# Patient Record
Sex: Male | Born: 1965 | Race: White | Hispanic: No | Marital: Single | Smoking: Current every day smoker
Health system: Southern US, Community
[De-identification: ages and names within clinical notes are randomized; demographics above are authoritative.]

## PROBLEM LIST (undated history)

## (undated) DIAGNOSIS — H547 Unspecified visual loss: Secondary | ICD-10-CM

## (undated) HISTORY — PX: WRIST FRACTURE SURGERY: SHX121

## (undated) HISTORY — PX: EYE SURGERY: SHX253

---

## 2016-03-26 DIAGNOSIS — H3552 Pigmentary retinal dystrophy: Secondary | ICD-10-CM | POA: Diagnosis not present

## 2016-03-26 DIAGNOSIS — H540X44 Blindness right eye category 4, blindness left eye category 4: Secondary | ICD-10-CM | POA: Diagnosis not present

## 2016-03-26 DIAGNOSIS — H5372 Impaired contrast sensitivity: Secondary | ICD-10-CM | POA: Diagnosis not present

## 2016-03-26 DIAGNOSIS — Z961 Presence of intraocular lens: Secondary | ICD-10-CM | POA: Diagnosis not present

## 2016-12-31 DIAGNOSIS — H6980 Other specified disorders of Eustachian tube, unspecified ear: Secondary | ICD-10-CM | POA: Diagnosis not present

## 2017-04-04 DIAGNOSIS — J209 Acute bronchitis, unspecified: Secondary | ICD-10-CM | POA: Diagnosis not present

## 2017-11-13 DIAGNOSIS — H6691 Otitis media, unspecified, right ear: Secondary | ICD-10-CM | POA: Diagnosis not present

## 2017-11-13 DIAGNOSIS — J209 Acute bronchitis, unspecified: Secondary | ICD-10-CM | POA: Diagnosis not present

## 2018-05-15 DIAGNOSIS — R6889 Other general symptoms and signs: Secondary | ICD-10-CM | POA: Diagnosis not present

## 2018-06-05 ENCOUNTER — Emergency Department (HOSPITAL_COMMUNITY): Payer: Medicare Other

## 2018-06-05 ENCOUNTER — Other Ambulatory Visit: Payer: Self-pay

## 2018-06-05 ENCOUNTER — Emergency Department (HOSPITAL_COMMUNITY)
Admission: EM | Admit: 2018-06-05 | Discharge: 2018-06-05 | Disposition: A | Discharge status: Home / Self Care | Payer: Medicare Other | Attending: Emergency Medicine | Admitting: Emergency Medicine

## 2018-06-05 DIAGNOSIS — R4020 Unspecified coma: Secondary | ICD-10-CM | POA: Diagnosis not present

## 2018-06-05 DIAGNOSIS — R55 Syncope and collapse: Secondary | ICD-10-CM | POA: Insufficient documentation

## 2018-06-05 DIAGNOSIS — R05 Cough: Secondary | ICD-10-CM | POA: Diagnosis not present

## 2018-06-05 DIAGNOSIS — J40 Bronchitis, not specified as acute or chronic: Secondary | ICD-10-CM | POA: Diagnosis not present

## 2018-06-05 DIAGNOSIS — R42 Dizziness and giddiness: Secondary | ICD-10-CM | POA: Diagnosis not present

## 2018-06-05 DIAGNOSIS — Z79899 Other long term (current) drug therapy: Secondary | ICD-10-CM | POA: Diagnosis not present

## 2018-06-05 DIAGNOSIS — R0602 Shortness of breath: Secondary | ICD-10-CM | POA: Diagnosis not present

## 2018-06-05 LAB — CBC WITH DIFFERENTIAL/PLATELET
Abs Immature Granulocytes: 0.03 K/uL (ref 0.00–0.07)
Basophils Absolute: 0 K/uL (ref 0.0–0.1)
Basophils Relative: 0 %
Eosinophils Absolute: 0.2 K/uL (ref 0.0–0.5)
Eosinophils Relative: 2 %
HCT: 42.4 % (ref 39.0–52.0)
Hemoglobin: 14.5 g/dL (ref 13.0–17.0)
Immature Granulocytes: 0 %
Lymphocytes Relative: 26 %
Lymphs Abs: 2.1 K/uL (ref 0.7–4.0)
MCH: 32.4 pg (ref 26.0–34.0)
MCHC: 34.2 g/dL (ref 30.0–36.0)
MCV: 94.6 fL (ref 80.0–100.0)
Monocytes Absolute: 0.9 K/uL (ref 0.1–1.0)
Monocytes Relative: 11 %
Neutro Abs: 4.9 K/uL (ref 1.7–7.7)
Neutrophils Relative %: 61 %
Platelets: 247 K/uL (ref 150–400)
RBC: 4.48 MIL/uL (ref 4.22–5.81)
RDW: 13.7 % (ref 11.5–15.5)
WBC: 8.1 K/uL (ref 4.0–10.5)
nRBC: 0 % (ref 0.0–0.2)

## 2018-06-05 LAB — COMPREHENSIVE METABOLIC PANEL
ALBUMIN: 4.4 g/dL (ref 3.5–5.0)
ALT: 16 U/L (ref 0–44)
AST: 21 U/L (ref 15–41)
Alkaline Phosphatase: 55 U/L (ref 38–126)
Anion gap: 7 (ref 5–15)
BUN: 9 mg/dL (ref 6–20)
CO2: 24 mmol/L (ref 22–32)
Calcium: 8.7 mg/dL — ABNORMAL LOW (ref 8.9–10.3)
Chloride: 102 mmol/L (ref 98–111)
Creatinine, Ser: 0.66 mg/dL (ref 0.61–1.24)
GFR calc Af Amer: >60 mL/min (ref >60)
GFR calc non Af Amer: >60 mL/min (ref >60)
GLUCOSE: 95 mg/dL (ref 70–99)
Potassium: 4.2 mmol/L (ref 3.5–5.1)
Sodium: 133 mmol/L — ABNORMAL LOW (ref 135–145)
Total Bilirubin: 0.7 mg/dL (ref 0.3–1.2)
Total Protein: 7 g/dL (ref 6.5–8.1)

## 2018-06-05 LAB — D-DIMER, QUANTITATIVE: D-Dimer, Quant: 0.52 ug{FEU}/mL — ABNORMAL HIGH (ref 0.00–0.50)

## 2018-06-05 LAB — TROPONIN I: Troponin I: <0.03 ng/mL (ref <0.03)

## 2018-06-05 MED ORDER — SODIUM CHLORIDE 0.9 % IV BOLUS
500.0000 mL | Freq: Once | INTRAVENOUS | Status: AC
  Administered 2018-06-05: 500 mL via INTRAVENOUS

## 2018-06-05 MED ORDER — ALBUTEROL SULFATE HFA 108 (90 BASE) MCG/ACT IN AERS
2.0000 | INHALATION_SPRAY | Freq: Once | RESPIRATORY_TRACT | Status: AC
  Administered 2018-06-05: 2 via RESPIRATORY_TRACT
  Filled 2018-06-05: qty 6.7

## 2018-06-05 MED ORDER — IOHEXOL 350 MG/ML SOLN
100.0000 mL | Freq: Once | INTRAVENOUS | Status: AC | PRN
  Administered 2018-06-05: 80 mL via INTRAVENOUS

## 2018-06-05 MED ORDER — SODIUM CHLORIDE (PF) 0.9 % IJ SOLN
INTRAMUSCULAR | Status: AC
  Filled 2018-06-05: qty 50

## 2018-06-05 NOTE — ED Triage Notes (Signed)
Pt BIBA from [employer] (blind for 25 yr) c/o being very light headed, blacked out for 15 sec.  Nearby witness able to ease him to floor prior to falling. C/o chest tightness (pressure) x1 week.

## 2018-06-05 NOTE — ED Provider Notes (Signed)
[facility]-EMERGENCY DEPT Provider Note   CSN: [id number] Arrival date & time: 06/05/18  0847    History   Chief Complaint Chief Complaint  Patient presents with   Near Syncope    HPI Henry Roberts is a 53 y.o. male.     The history is provided by the patient. No language interpreter was used.  Near Syncope    Henry Roberts is a 53 y.o. male who presents to the Emergency Department complaining of near syncope. He presents to the emergency department by EMS for evaluation following a near syncopal event. He states that he was at work this morning and standing for about an hour when he began to feel lightheaded and dizzy. Things began to go dark but he states that he did not pass out. He lowered himself to the ground and then he began to feel better. He does have associated shortness of breath over the last week with chest tightness. Symptoms are worse on exertion. He has a chronic cough that is also worse over the last 1 to 3 weeks. He denies any fevers or chills. He was diaphoretic during his near syncopal event. He denies any leg swelling or pain. No vomiting, diarrhea, abdominal pain. 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 He does not take any medications. He does smoke cigarettes and drinks alcohol regularly. He has no personal or family history of blood clots or cardiac disease. No past medical history on file.  There are no active problems to display for this patient.       he has no significant past medical history.  Home Medications    Prior to Admission medications   Medication Sig Start Date End Date Taking? Authorizing Provider  Ascorbic Acid (VITAMIN C) 1000 MG tablet Take 1,000 mg by mouth daily.   Yes [provider]  Cholecalciferol (HM VITAMIN D3) 100 MCG (4000 UT) CAPS Take 4,000 Units by mouth daily.    Yes [provider]  Pseudoephedrine-APAP-DM (DAYQUIL MULTI-SYMPTOM PO) Take 1 capsule by mouth daily as  needed (congestion).   Yes [provider]  Zinc 30 MG CAPS Take 30 mg by mouth daily.   Yes [provider]    Family History No family history on file.  Social History Social History   Tobacco Use   Smoking status: Not on file  Substance Use Topics   Alcohol use: Not on file   Drug use: Not on file     Allergies   Patient has no allergy information on record.   Review of Systems Review of Systems  Cardiovascular: Positive for near-syncope.  All other systems reviewed and are negative.    Physical Exam Updated Vital Signs BP 124/75    Pulse (!) 58    Temp 98.3 F (36.8 C) (Oral)    Resp 15    Ht 5\' 6"  (1.676 m)    Wt 74.8 kg    SpO2 99%    BMI 26.63 kg/m   Physical Exam Vitals signs and nursing note reviewed.  Constitutional:      Appearance: He is well-developed.  HENT:     Head: Normocephalic and atraumatic.  Cardiovascular:     Rate and Rhythm: Normal rate and regular rhythm.     Heart sounds: No murmur.  Pulmonary:     Effort: Pulmonary effort is normal. No respiratory distress.     Breath sounds: Normal breath sounds.  Abdominal:     Palpations: Abdomen is soft.  Tenderness: There is no abdominal tenderness. There is no guarding or rebound.  Musculoskeletal:        General: No swelling or tenderness.  Skin:    General: Skin is warm and dry.  Neurological:     Mental Status: He is alert and oriented to person, place, and time.  Psychiatric:        Behavior: Behavior normal.      ED Treatments / Results  Labs (all labs ordered are listed, but only abnormal results are displayed) Labs Reviewed  COMPREHENSIVE METABOLIC PANEL - Abnormal; Notable for the following components:      Result Value   Sodium 133 (*)    Calcium 8.7 (*)    All other components within normal limits  D-DIMER, QUANTITATIVE (NOT AT [facility]) - Abnormal; Notable for the following components:   D-Dimer, Quant 0.52 (*)    All other components within normal  limits  TROPONIN I  CBC WITH DIFFERENTIAL/PLATELET    EKG EKG Interpretation  Date/Time:  Friday June 05 2018 09:27:51 EDT Ventricular Rate:  55 PR Interval:    QRS Duration: 92 QT Interval:  438 QTC Calculation: 419 R Axis:   49 Text Interpretation:  Sinus rhythm no prior available for comparison Confirmed by Tilden Fossa [phone]) on 06/05/2018 9:34:21 AM   Radiology Ct Angio Chest Pe W/cm &/or Wo Cm  Result Date: 06/05/2018 CLINICAL DATA:  53 year old male with syncope EXAM: CT ANGIOGRAPHY CHEST WITH CONTRAST TECHNIQUE: Multidetector CT imaging of the chest was performed using the standard protocol during bolus administration of intravenous contrast. Multiplanar CT image reconstructions and MIPs were obtained to evaluate the vascular anatomy. CONTRAST:  13mL OMNIPAQUE IOHEXOL 350 MG/ML SOLN COMPARISON:  None. FINDINGS: Cardiovascular: Heart: No cardiomegaly. No pericardial fluid/thickening. No significant coronary calcifications. Aorta: Unremarkable course, caliber, contour of the thoracic aorta. No aneurysm or dissection flap. No periaortic fluid. Mild atherosclerotic changes of the aortic arch. Pulmonary arteries: No central, lobar, segmental, or proximal subsegmental filling defects. Mediastinum/Nodes: No mediastinal adenopathy. Unremarkable appearance of the thoracic esophagus. Unremarkable appearance of the thoracic inlet and thyroid. Lungs/Pleura: Central airways clear. Mild bronchial wall thickening. No pleural effusion. No confluent airspace disease. No pneumothorax. Upper Abdomen: No acute. Musculoskeletal: No acute displaced fracture. No significant degenerative changes of the spine. Review of the MIP images confirms the above findings. IMPRESSION: CT negative for pulmonary emboli. Mild bronchial wall thickening, may indicate acute or chronic bronchitis, with no evidence of pneumonia. Aortic Atherosclerosis (ICD10-I70.0). Electronically Signed   By: Gilmer Mor D.O.   On:  06/05/2018 11:27   Dg Chest Port 1 View  Result Date: 06/05/2018 CLINICAL DATA:  Syncope with cough and shortness of breath EXAM: PORTABLE CHEST 1 VIEW COMPARISON:  None. FINDINGS: Lungs are clear. Heart size and pulmonary vascularity are normal. No adenopathy. There is aortic atherosclerosis. No bone lesions. IMPRESSION: No edema or consolidation. Heart size within normal limits. Aortic Atherosclerosis (ICD10-I70.0). Electronically Signed   By: Bretta Bang III M.D.   On: 06/05/2018 09:24    Procedures Procedures (including critical care time)  Medications Ordered in ED Medications  sodium chloride (PF) 0.9 % injection (has no administration in time range)  sodium chloride 0.9 % bolus 500 mL (0 mLs Intravenous Stopped 06/05/18 1207)  iohexol (OMNIPAQUE) 350 MG/ML injection 100 mL (80 mLs Intravenous Contrast Given 06/05/18 1105)  albuterol (PROVENTIL HFA;VENTOLIN HFA) 108 (90 Base) MCG/ACT inhaler 2 puff (2 puffs Inhalation Given 06/05/18 1213)     Initial Impression / Assessment  and Plan / ED Course  I have reviewed the triage vital signs and the nursing notes.  Pertinent labs & imaging results that were available during my care of the patient were reviewed by me and considered in my medical decision making (see chart for details).        Patient presents to the emergency department for evaluation following a near syncopal episode. He has experienced increased shortness of breath over the last few weeks. He has no risk factors for COVID infection. He is in no acute distress on examination. Current presentation is not consistent with ACS, CHF, pneumonia. D dimer was mildly elevated and CTA was obtained, which was negative for PE. Given his history of tobacco abuse, feel he may have an element of chronic bronchitis. Discussed with patient smoking cessation. Will provide MDI to see if he has improvement in his symptoms. Discussed importance of outpatient follow-up and return  precautions.  BMP with mild hyponatremia, may be secondary to chronic alcohol use versus dehydration, he was treated with IV fluids.  Final Clinical Impressions(s) / ED Diagnoses   Final diagnoses:  Near syncope  Bronchitis    ED Discharge Orders    None       Tilden Fossa, MD 06/05/18 1237

## 2018-06-05 NOTE — ED Notes (Signed)
Bed: WA09 Expected date:  Expected time:  Means of arrival:  Comments: EMS-syncope 

## 2018-06-05 NOTE — Discharge Instructions (Addendum)
You can use 2 puffs every 4-6 hours as needed for shortness of breath or wheezing.  Get rechecked immediately if you develop severe symptoms.    Drink plenty of fluids.

## 2018-06-05 NOTE — ED Notes (Signed)
Discharge paperwork reviewed with pt, IV removed, pt assisted to ED entrance where he was going to order an UBER for transport home.  Pt did not have any questions or concerns at this time.  Pt ambulatory at discharge.

## 2018-06-05 NOTE — ED Notes (Signed)
Pt assisted to ambulate to bathroom for urine sample.  Pt able to walk appx 100 feet, using blind cane, with no issue.

## 2018-06-10 ENCOUNTER — Telehealth: Payer: Medicare Other | Admitting: Family

## 2018-06-10 DIAGNOSIS — Z7189 Other specified counseling: Secondary | ICD-10-CM

## 2018-06-10 DIAGNOSIS — R6889 Other general symptoms and signs: Secondary | ICD-10-CM | POA: Diagnosis not present

## 2018-06-10 MED ORDER — BENZONATATE 100 MG PO CAPS: 100.0000 mg | ORAL_CAPSULE | Freq: Three times a day (TID) | ORAL | 0 refills | Status: DC | PRN

## 2018-06-10 NOTE — Progress Notes (Signed)
E-Visit for Corona Virus Screening  Based on your current symptoms, you may very well have the virus, however your symptoms are mild. Currently, not all patients are being tested. If the symptoms are mild and there is not a known exposure, performing the test is not indicated.   Approximately 5 minutes was spent documenting and reviewing patient's chart.    Coronavirus disease 2019 (COVID-19) is a respiratory illness that can spread from person to person. The virus that causes COVID-19 is a new virus that was first identified in the country of [place] but is now found in multiple other countries and has spread to the [place].  Symptoms associated with the virus are mild to severe fever, cough, and shortness of breath. There is currently no vaccine to protect against COVID-19, and there is no specific antiviral treatment for the virus.   To be considered HIGH RISK for Coronavirus (COVID-19), you have to meet the following criteria:  . Traveled to [place], [place], [place], [place] or [place]; or in the [place] to [place], [place], [place], or [place]; and have fever, cough, and shortness of breath within the last 2 weeks of travel OR  . Been in close contact with a person diagnosed with COVID-19 within the last 2 weeks and have fever, cough, and shortness of breath  . IF YOU DO NOT MEET THESE CRITERIA, YOU ARE CONSIDERED LOW RISK FOR COVID-19.   It is vitally important that if you feel that you have an infection such as this virus or any other virus that you stay home and away from places where you may spread it to others.  You should self-quarantine for 14 days if you have symptoms that could potentially be coronavirus and avoid contact with people age 65 and older.   You can use medication such as A prescription cough medication called Tessalon Perles 100 mg. You may take 1-2 capsules every 8 hours as needed for cough  You may also take acetaminophen (Tylenol) as needed for  fever.   Reduce your risk of any infection by using the same precautions used for avoiding the common cold or flu:  . Wash your hands often with soap and warm water for at least 20 seconds.  If soap and water are not readily available, use an alcohol-based hand sanitizer with at least 60% alcohol.  . If coughing or sneezing, cover your mouth and nose by coughing or sneezing into the elbow areas of your shirt or coat, into a tissue or into your sleeve (not your hands). . Avoid shaking hands with others and consider head nods or verbal greetings only. . Avoid touching your eyes, nose, or mouth with unwashed hands.  . Avoid close contact with people who are sick. . Avoid places or events with large numbers of people in one location, like concerts or sporting events. . Carefully consider travel plans you have or are making. . If you are planning any travel outside or inside the [place], visit the CDC's Travelers' Health webpage for the latest health notices. . If you have some symptoms but not all symptoms, continue to monitor at home and seek medical attention if your symptoms worsen. . If you are having a medical emergency, call 911.  HOME CARE . Only take medications as instructed by your medical team. . Drink plenty of fluids and get plenty of rest. . A steam or ultrasonic humidifier can help if you have congestion.   GET HELP RIGHT AWAY IF: .   You develop worsening fever. . You become short of breath . You cough up blood. . Your symptoms become more severe MAKE SURE YOU   Understand these instructions.  Will watch your condition.  Will get help right away if you are not doing well or get worse.  Your e-visit answers were reviewed by a board certified advanced clinical practitioner to complete your personal care plan.  Depending on the condition, your plan could have included both over the counter or prescription medications.  If there is a problem please reply once you have received a  response from your provider. Your safety is important to us.  If you have drug allergies check your prescription carefully.    You can use MyChart to ask questions about today's visit, request a non-urgent call back, or ask for a work or school excuse for 24 hours related to this e-Visit. If it has been greater than 24 hours you will need to follow up with your provider, or enter a new e-Visit to address those concerns. You will get an e-mail in the next two days asking about your experience.  I hope that your e-visit has been valuable and will speed your recovery. Thank you for using e-visits.    

## 2018-06-22 ENCOUNTER — Telehealth: Payer: Medicare Other | Admitting: Physician Assistant

## 2018-06-22 DIAGNOSIS — R05 Cough: Secondary | ICD-10-CM

## 2018-06-22 DIAGNOSIS — R059 Cough, unspecified: Secondary | ICD-10-CM

## 2018-06-22 NOTE — Progress Notes (Signed)
Patient reports improvement since e-vist on 06/10/2018. Requesting return to work note. Work note written and sent to patient.

## 2018-06-23 ENCOUNTER — Ambulatory Visit (HOSPITAL_COMMUNITY)
Admission: EM | Admit: 2018-06-23 | Discharge: 2018-06-23 | Disposition: A | Discharge status: Home / Self Care | Payer: Medicare Other | Attending: Family Medicine | Admitting: Family Medicine

## 2018-06-23 ENCOUNTER — Encounter (HOSPITAL_COMMUNITY): Payer: Self-pay | Admitting: Emergency Medicine

## 2018-06-23 ENCOUNTER — Other Ambulatory Visit: Payer: Self-pay

## 2018-06-23 DIAGNOSIS — Z0289 Encounter for other administrative examinations: Secondary | ICD-10-CM

## 2018-06-23 DIAGNOSIS — Z7689 Persons encountering health services in other specified circumstances: Secondary | ICD-10-CM

## 2018-06-23 HISTORY — DX: Unspecified visual loss: H54.7

## 2018-06-23 NOTE — ED Triage Notes (Signed)
Pt here for note to return after 14 day quarantine

## 2018-06-23 NOTE — Discharge Instructions (Addendum)
Your exam was normal.  I feel it is safe for you to return to work.

## 2018-06-24 NOTE — ED Provider Notes (Signed)
[facility]    CSN: [id number] Arrival date & time: 06/23/18  1443     History   Chief Complaint Chief Complaint  Patient presents with  . Letter for School/Work    HPI Henry Roberts is a 53 y.o. male.   Pt is a 53 year old male with past medical history of blindness.  He presents today with wanting a note to return to work.  He was seen approximately 2 weeks ago and told to have some sort of viral illness and could not rule out coronavirus.  He has been quarantined for 14 days.  Reports that he has not had any fever or symptoms and 7 days.  He would like to return to work on Thursday. No fever, chills, body aches, night sweats, cough, congestion, ear pain, sore throat.   ROS per HPI      Past Medical History:  Diagnosis Date  . Blind     There are no active problems to display for this patient.   History reviewed. No pertinent surgical history.     Home Medications    Prior to Admission medications   Medication Sig Start Date End Date Taking? Authorizing Provider  Ascorbic Acid (VITAMIN C) 1000 MG tablet Take 1,000 mg by mouth daily.    [provider]  benzonatate (TESSALON PERLES) 100 MG capsule Take 1 capsule (100 mg total) by mouth 3 (three) times daily as needed. 06/10/18   Jannifer RodneyHawks, Christy A, FNP  Cholecalciferol (HM VITAMIN D3) 100 MCG (4000 UT) CAPS Take 4,000 Units by mouth daily.     [provider]  Pseudoephedrine-APAP-DM (DAYQUIL MULTI-SYMPTOM PO) Take 1 capsule by mouth daily as needed (congestion).    [provider]  Zinc 30 MG CAPS Take 30 mg by mouth daily.    [provider]    Family History History reviewed. No pertinent family history.  Social History Social History   Tobacco Use  . Smoking status: Current Every Day Smoker  . Smokeless tobacco: Never Used  Substance Use Topics  . Alcohol use: Never    Frequency: Never  . Drug use: Never     Allergies   Patient has no known allergies.   Review of Systems Review of Systems   Physical Exam Triage Vital Signs ED Triage Vitals  Enc Vitals Group     BP 06/23/18 1510 132/82     Pulse Rate 06/23/18 1510 84     Resp 06/23/18 1510 18     Temp 06/23/18 1510 97.9 F (36.6 C)     Temp Source 06/23/18 1510 Oral     SpO2 06/23/18 1510 96 %     Weight --      Height --      Head Circumference --      Peak Flow --      Pain Score 06/23/18 1511 0     Pain Loc --      Pain Edu? --      Excl. in [place]? --    No data found.  Updated Vital Signs BP 132/82 (BP Location: Right Arm)   Pulse 84   Temp 97.9 F (36.6 C) (Oral)   Resp 18   SpO2 96%   Visual Acuity Right Eye Distance:   Left Eye Distance:   Bilateral Distance:    Right Eye Near:   Left Eye Near:    Bilateral Near:     Physical Exam Vitals signs and nursing note reviewed.  Constitutional:  General: He is not in acute distress.    Appearance: He is well-developed. He is not ill-appearing, toxic-appearing or diaphoretic.  HENT:     Head: Normocephalic and atraumatic.     Right Ear: Tympanic membrane and ear canal normal.     Left Ear: Tympanic membrane and ear canal normal.     Nose: Nose normal.     Mouth/Throat:     Pharynx: Oropharynx is clear.  Eyes:     Conjunctiva/sclera: Conjunctivae normal.  Neck:     Musculoskeletal: Normal range of motion and neck supple.  Cardiovascular:     Rate and Rhythm: Normal rate and regular rhythm.     Heart sounds: No murmur.  Pulmonary:     Effort: Pulmonary effort is normal. No respiratory distress.     Breath sounds: Normal breath sounds.  Abdominal:     Palpations: Abdomen is soft.     Tenderness: There is no abdominal tenderness.  Musculoskeletal: Normal range of motion.  Skin:    General: Skin is warm and dry.  Neurological:     Mental Status: He is alert.  Psychiatric:        Mood and Affect: Mood normal.      UC Treatments / Results  Labs (all labs ordered are listed, but only abnormal  results are displayed) Labs Reviewed - No data to display  EKG None  Radiology No results found.  Procedures Procedures (including critical care time)  Medications Ordered in UC Medications - No data to display  Initial Impression / Assessment and Plan / UC Course  I have reviewed the triage vital signs and the nursing notes.  Pertinent labs & imaging results that were available during my care of the patient were reviewed by me and considered in my medical decision making (see chart for details).     Pt exam normal. It is safe for him to return to work  Work release given.  Final Clinical Impressions(s) / UC Diagnoses   Final diagnoses:  Return to work exam     Discharge Instructions     Your exam was normal.  I feel it is safe for you to return to work.      ED Prescriptions    None     Controlled Substance Prescriptions [place] Controlled Substance Registry consulted? Not Applicable   Janace Aris, NP 06/24/18 [phone]

## 2018-06-29 ENCOUNTER — Encounter (HOSPITAL_COMMUNITY): Payer: Self-pay

## 2018-06-29 ENCOUNTER — Ambulatory Visit (HOSPITAL_COMMUNITY)
Admission: EM | Admit: 2018-06-29 | Discharge: 2018-06-29 | Disposition: A | Discharge status: Home / Self Care | Payer: Medicare Other | Attending: Family Medicine | Admitting: Family Medicine

## 2018-06-29 ENCOUNTER — Other Ambulatory Visit: Payer: Self-pay

## 2018-06-29 DIAGNOSIS — M791 Myalgia, unspecified site: Secondary | ICD-10-CM | POA: Insufficient documentation

## 2018-06-29 LAB — CBC
HCT: 42.6 % (ref 39.0–52.0)
Hemoglobin: 14.7 g/dL (ref 13.0–17.0)
MCH: 31.7 pg (ref 26.0–34.0)
MCHC: 34.5 g/dL (ref 30.0–36.0)
MCV: 92 fL (ref 80.0–100.0)
Platelets: 251 10*3/uL (ref 150–400)
RBC: 4.63 MIL/uL (ref 4.22–5.81)
RDW: 13.4 % (ref 11.5–15.5)
WBC: 10.5 10*3/uL (ref 4.0–10.5)
nRBC: 0 % (ref 0.0–0.2)

## 2018-06-29 LAB — SEDIMENTATION RATE: Sed Rate: 0 mm/hr (ref 0–16)

## 2018-06-29 LAB — C-REACTIVE PROTEIN: CRP: <0.8 mg/dL (ref <1.0)

## 2018-06-29 NOTE — ED Triage Notes (Signed)
Pt cc he has muscle pain in his arm and legs. Pt states he feels weak. This started today.

## 2018-06-29 NOTE — Discharge Instructions (Addendum)
We will call you with the lab work from today  Please follow up if your pain doesn't improve  Please try to establish care with [facility] physicians.  Please return if your symptoms worsen.

## 2018-06-29 NOTE — ED Provider Notes (Signed)
[place]    CSN: [id number] Arrival date & time: 06/29/18  0932     History   Chief Complaint Chief Complaint  Patient presents with  . Muscle Pain    HPI Henry Roberts is a 53 y.o. male. is presenting today with b/l upper arm pain and b/l thigh pain. This started this morning. He feels weak and doesn't feel he would be able to do his job. 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 He was recently placed in quarantine for suspected coronavirus infection. He was released to go back to work on 4/14. Denies any fevers.  HPI  Past Medical History:  Diagnosis Date  . Blind     There are no active problems to display for this patient.   History reviewed. No pertinent surgical history.     Home Medications    Prior to Admission medications   Medication Sig Start Date End Date Taking? Authorizing Provider  Ascorbic Acid (VITAMIN C) 1000 MG tablet Take 1,000 mg by mouth daily.    [provider]  benzonatate (TESSALON PERLES) 100 MG capsule Take 1 capsule (100 mg total) by mouth 3 (three) times daily as needed. 06/10/18   Evelina Dun A, FNP  Cholecalciferol (HM VITAMIN D3) 100 MCG (4000 UT) CAPS Take 4,000 Units by mouth daily.     [provider]  Pseudoephedrine-APAP-DM (DAYQUIL MULTI-SYMPTOM PO) Take 1 capsule by mouth daily as needed (congestion).    [provider]  Zinc 30 MG CAPS Take 30 mg by mouth daily.    [provider]    Family History History reviewed. No pertinent family history.  Social History Social History   Tobacco Use  . Smoking status: Current Every Day Smoker  . Smokeless tobacco: Never Used  Substance Use Topics  . Alcohol use: Never    Frequency: Never  . Drug use: Never     Allergies   Patient has no known allergies.   Review of Systems Review of Systems  Constitutional: Negative for fever.  HENT: Negative for congestion.   Respiratory: Negative for  cough.   Cardiovascular: Negative for chest pain.  Gastrointestinal: Negative for abdominal pain.  Musculoskeletal: Negative for gait problem.  Skin: Negative for color change.  Neurological: Positive for weakness.  Hematological: Negative for adenopathy.     Physical Exam Triage Vital Signs ED Triage Vitals  Enc Vitals Group     BP 06/29/18 0948 118/86     Pulse Rate 06/29/18 0948 74     Resp 06/29/18 0948 18     Temp 06/29/18 0948 98.1 F (36.7 C)     Temp Source 06/29/18 0948 Oral     SpO2 06/29/18 0948 99 %     Weight 06/29/18 0949 165 lb (74.8 kg)     Height --      Head Circumference --      Peak Flow --      Pain Score 06/29/18 0949 3     Pain Loc --      Pain Edu? --      Excl. in [place]? --    No data found.  Updated Vital Signs BP 118/86 (BP Location: Right Arm)   Pulse 74   Temp 98.1 F (36.7 C) (Oral)   Resp 18   Wt 74.8 kg   SpO2 99%   BMI 26.63 kg/m   Visual Acuity Right Eye Distance:   Left Eye Distance:  Bilateral Distance:    Right Eye Near:   Left Eye Near:    Bilateral Near:     Physical Exam  Gen: NAD, alert, cooperative with exam, well-appearing ENT: normal lips, normal nasal mucosa,  Eye:  normal conjunctiva and lids CV:  no edema, +2 pedal pulses   Resp: no accessory muscle use, non-labored,  Skin: no rashes, no areas of induration  Neuro: normal tone, normal sensation to touch Psych:  normal insight, alert and oriented MSK:  Upper arm:  Normal ROM  TTP along the medial bicep b/l  Normal shoulder ROM  Normal grip strength  Left and right leg:  Normal IR and ER of the hips  Normal strength to hip flexion, knee flexion and extension  Neurovascularly intact     UC Treatments / Results  Labs (all labs ordered are listed, but only abnormal results are displayed) Labs Reviewed  CBC  SEDIMENTATION RATE  C-REACTIVE PROTEIN    EKG None  Radiology No results found.  Procedures Procedures (including critical care time)   Medications Ordered in UC Medications - No data to display  Initial Impression / Assessment and Plan / UC Course  I have reviewed the triage vital signs and the nursing notes.  Pertinent labs & imaging results that were available during my care of the patient were reviewed by me and considered in my medical decision making (see chart for details).     Henry Roberts is a 53 yo M that is presenting with myalgias. Occurring for one day in duration. Bilateral upper arm and thighs are areas of pain and weakness. Unclear if PMR or post viral. No weakness or decreases in range of motion. Will check ESR, CRP and CBC. Counseled on follow up and return.   Final Clinical Impressions(s) / UC Diagnoses   Final diagnoses:  Myalgia     Discharge Instructions     We will call you with the lab work from today  Please follow up if your pain doesn't improve  Please try to establish care with [facility] physicians.  Please return if your symptoms worsen.     ED Prescriptions    None     Controlled Substance Prescriptions [place] Controlled Substance Registry consulted? Not Applicable   Rosemarie Ax, MD 06/29/18 1044

## 2018-07-02 ENCOUNTER — Telehealth: Payer: Self-pay | Admitting: Family Medicine

## 2018-07-02 NOTE — Telephone Encounter (Signed)
Left VM for patient. If he calls back please have him speak with a nurse/CMA and inform that all of his lab work is normal. If he is still having symptoms then he should establish care with a primary care and go from there. The [facility] can report results to patient.   If any questions then please take the best time and phone number to call and I will try to call him back.   Myra Rude, MD [facility] 07/02/2018, 8:34 AM

## 2019-04-13 DIAGNOSIS — Z03818 Encounter for observation for suspected exposure to other biological agents ruled out: Secondary | ICD-10-CM | POA: Diagnosis not present

## 2019-04-14 DIAGNOSIS — R05 Cough: Secondary | ICD-10-CM | POA: Diagnosis not present

## 2019-04-14 DIAGNOSIS — Z20828 Contact with and (suspected) exposure to other viral communicable diseases: Secondary | ICD-10-CM | POA: Diagnosis not present

## 2019-04-16 DIAGNOSIS — Z20828 Contact with and (suspected) exposure to other viral communicable diseases: Secondary | ICD-10-CM | POA: Diagnosis not present

## 2019-04-17 DIAGNOSIS — Z20828 Contact with and (suspected) exposure to other viral communicable diseases: Secondary | ICD-10-CM | POA: Diagnosis not present

## 2019-04-17 DIAGNOSIS — R05 Cough: Secondary | ICD-10-CM | POA: Diagnosis not present

## 2019-05-14 DIAGNOSIS — R5383 Other fatigue: Secondary | ICD-10-CM | POA: Diagnosis not present

## 2019-05-14 DIAGNOSIS — E669 Obesity, unspecified: Secondary | ICD-10-CM | POA: Diagnosis not present

## 2019-07-08 DIAGNOSIS — Z20828 Contact with and (suspected) exposure to other viral communicable diseases: Secondary | ICD-10-CM | POA: Diagnosis not present

## 2019-07-09 DIAGNOSIS — R05 Cough: Secondary | ICD-10-CM | POA: Diagnosis not present

## 2019-07-09 DIAGNOSIS — Z20828 Contact with and (suspected) exposure to other viral communicable diseases: Secondary | ICD-10-CM | POA: Diagnosis not present

## 2019-10-01 DIAGNOSIS — Z20828 Contact with and (suspected) exposure to other viral communicable diseases: Secondary | ICD-10-CM | POA: Diagnosis not present

## 2019-10-02 DIAGNOSIS — Z20828 Contact with and (suspected) exposure to other viral communicable diseases: Secondary | ICD-10-CM | POA: Diagnosis not present

## 2019-10-02 DIAGNOSIS — R05 Cough: Secondary | ICD-10-CM | POA: Diagnosis not present

## 2019-10-22 DIAGNOSIS — Z20828 Contact with and (suspected) exposure to other viral communicable diseases: Secondary | ICD-10-CM | POA: Diagnosis not present

## 2019-10-22 DIAGNOSIS — R05 Cough: Secondary | ICD-10-CM | POA: Diagnosis not present

## 2019-10-23 DIAGNOSIS — Z712 Person consulting for explanation of examination or test findings: Secondary | ICD-10-CM | POA: Diagnosis not present

## 2019-10-23 DIAGNOSIS — Z20828 Contact with and (suspected) exposure to other viral communicable diseases: Secondary | ICD-10-CM | POA: Diagnosis not present

## 2019-10-23 DIAGNOSIS — R05 Cough: Secondary | ICD-10-CM | POA: Diagnosis not present

## 2019-10-29 DIAGNOSIS — Z20828 Contact with and (suspected) exposure to other viral communicable diseases: Secondary | ICD-10-CM | POA: Diagnosis not present

## 2019-10-29 DIAGNOSIS — R05 Cough: Secondary | ICD-10-CM | POA: Diagnosis not present

## 2019-10-30 DIAGNOSIS — Z20828 Contact with and (suspected) exposure to other viral communicable diseases: Secondary | ICD-10-CM | POA: Diagnosis not present

## 2019-10-30 DIAGNOSIS — R05 Cough: Secondary | ICD-10-CM | POA: Diagnosis not present

## 2019-11-05 DIAGNOSIS — Z20828 Contact with and (suspected) exposure to other viral communicable diseases: Secondary | ICD-10-CM | POA: Diagnosis not present

## 2019-11-05 DIAGNOSIS — R05 Cough: Secondary | ICD-10-CM | POA: Diagnosis not present

## 2019-11-06 DIAGNOSIS — R05 Cough: Secondary | ICD-10-CM | POA: Diagnosis not present

## 2019-11-06 DIAGNOSIS — Z20828 Contact with and (suspected) exposure to other viral communicable diseases: Secondary | ICD-10-CM | POA: Diagnosis not present

## 2019-11-12 DIAGNOSIS — R05 Cough: Secondary | ICD-10-CM | POA: Diagnosis not present

## 2019-11-12 DIAGNOSIS — Z20828 Contact with and (suspected) exposure to other viral communicable diseases: Secondary | ICD-10-CM | POA: Diagnosis not present

## 2019-11-13 DIAGNOSIS — Z20828 Contact with and (suspected) exposure to other viral communicable diseases: Secondary | ICD-10-CM | POA: Diagnosis not present

## 2019-11-13 DIAGNOSIS — R05 Cough: Secondary | ICD-10-CM | POA: Diagnosis not present

## 2019-11-19 DIAGNOSIS — Z20828 Contact with and (suspected) exposure to other viral communicable diseases: Secondary | ICD-10-CM | POA: Diagnosis not present

## 2019-11-19 DIAGNOSIS — R05 Cough: Secondary | ICD-10-CM | POA: Diagnosis not present

## 2019-11-20 DIAGNOSIS — R05 Cough: Secondary | ICD-10-CM | POA: Diagnosis not present

## 2019-11-20 DIAGNOSIS — Z20828 Contact with and (suspected) exposure to other viral communicable diseases: Secondary | ICD-10-CM | POA: Diagnosis not present

## 2019-11-26 DIAGNOSIS — Z20828 Contact with and (suspected) exposure to other viral communicable diseases: Secondary | ICD-10-CM | POA: Diagnosis not present

## 2019-11-26 DIAGNOSIS — R05 Cough: Secondary | ICD-10-CM | POA: Diagnosis not present

## 2019-11-27 DIAGNOSIS — R05 Cough: Secondary | ICD-10-CM | POA: Diagnosis not present

## 2019-11-27 DIAGNOSIS — Z20828 Contact with and (suspected) exposure to other viral communicable diseases: Secondary | ICD-10-CM | POA: Diagnosis not present

## 2019-12-03 DIAGNOSIS — Z20828 Contact with and (suspected) exposure to other viral communicable diseases: Secondary | ICD-10-CM | POA: Diagnosis not present

## 2019-12-03 DIAGNOSIS — R05 Cough: Secondary | ICD-10-CM | POA: Diagnosis not present

## 2019-12-04 DIAGNOSIS — Z20828 Contact with and (suspected) exposure to other viral communicable diseases: Secondary | ICD-10-CM | POA: Diagnosis not present

## 2019-12-04 DIAGNOSIS — R05 Cough: Secondary | ICD-10-CM | POA: Diagnosis not present

## 2019-12-17 DIAGNOSIS — R059 Cough, unspecified: Secondary | ICD-10-CM | POA: Diagnosis not present

## 2019-12-17 DIAGNOSIS — Z20828 Contact with and (suspected) exposure to other viral communicable diseases: Secondary | ICD-10-CM | POA: Diagnosis not present

## 2019-12-18 DIAGNOSIS — Z20828 Contact with and (suspected) exposure to other viral communicable diseases: Secondary | ICD-10-CM | POA: Diagnosis not present

## 2019-12-18 DIAGNOSIS — R059 Cough, unspecified: Secondary | ICD-10-CM | POA: Diagnosis not present

## 2019-12-24 DIAGNOSIS — R059 Cough, unspecified: Secondary | ICD-10-CM | POA: Diagnosis not present

## 2019-12-24 DIAGNOSIS — Z20828 Contact with and (suspected) exposure to other viral communicable diseases: Secondary | ICD-10-CM | POA: Diagnosis not present

## 2019-12-25 DIAGNOSIS — R059 Cough, unspecified: Secondary | ICD-10-CM | POA: Diagnosis not present

## 2019-12-25 DIAGNOSIS — Z20828 Contact with and (suspected) exposure to other viral communicable diseases: Secondary | ICD-10-CM | POA: Diagnosis not present

## 2019-12-31 DIAGNOSIS — Z20828 Contact with and (suspected) exposure to other viral communicable diseases: Secondary | ICD-10-CM | POA: Diagnosis not present

## 2020-01-01 DIAGNOSIS — Z20828 Contact with and (suspected) exposure to other viral communicable diseases: Secondary | ICD-10-CM | POA: Diagnosis not present

## 2020-01-07 DIAGNOSIS — R059 Cough, unspecified: Secondary | ICD-10-CM | POA: Diagnosis not present

## 2020-01-07 DIAGNOSIS — Z20828 Contact with and (suspected) exposure to other viral communicable diseases: Secondary | ICD-10-CM | POA: Diagnosis not present

## 2020-01-08 DIAGNOSIS — Z20828 Contact with and (suspected) exposure to other viral communicable diseases: Secondary | ICD-10-CM | POA: Diagnosis not present

## 2020-01-08 DIAGNOSIS — R059 Cough, unspecified: Secondary | ICD-10-CM | POA: Diagnosis not present

## 2020-01-14 DIAGNOSIS — R059 Cough, unspecified: Secondary | ICD-10-CM | POA: Diagnosis not present

## 2020-01-14 DIAGNOSIS — Z20828 Contact with and (suspected) exposure to other viral communicable diseases: Secondary | ICD-10-CM | POA: Diagnosis not present

## 2020-01-15 DIAGNOSIS — Z20828 Contact with and (suspected) exposure to other viral communicable diseases: Secondary | ICD-10-CM | POA: Diagnosis not present

## 2020-01-15 DIAGNOSIS — R059 Cough, unspecified: Secondary | ICD-10-CM | POA: Diagnosis not present

## 2020-01-28 DIAGNOSIS — Z20828 Contact with and (suspected) exposure to other viral communicable diseases: Secondary | ICD-10-CM | POA: Diagnosis not present

## 2020-01-28 DIAGNOSIS — R059 Cough, unspecified: Secondary | ICD-10-CM | POA: Diagnosis not present

## 2020-01-29 DIAGNOSIS — Z20828 Contact with and (suspected) exposure to other viral communicable diseases: Secondary | ICD-10-CM | POA: Diagnosis not present

## 2020-01-29 DIAGNOSIS — R059 Cough, unspecified: Secondary | ICD-10-CM | POA: Diagnosis not present

## 2020-02-07 DIAGNOSIS — Z20828 Contact with and (suspected) exposure to other viral communicable diseases: Secondary | ICD-10-CM | POA: Diagnosis not present

## 2020-02-07 DIAGNOSIS — R059 Cough, unspecified: Secondary | ICD-10-CM | POA: Diagnosis not present

## 2020-02-08 DIAGNOSIS — R059 Cough, unspecified: Secondary | ICD-10-CM | POA: Diagnosis not present

## 2020-02-08 DIAGNOSIS — Z20828 Contact with and (suspected) exposure to other viral communicable diseases: Secondary | ICD-10-CM | POA: Diagnosis not present

## 2020-02-11 DIAGNOSIS — Z20828 Contact with and (suspected) exposure to other viral communicable diseases: Secondary | ICD-10-CM | POA: Diagnosis not present

## 2020-02-11 DIAGNOSIS — R059 Cough, unspecified: Secondary | ICD-10-CM | POA: Diagnosis not present

## 2020-02-12 DIAGNOSIS — Z20828 Contact with and (suspected) exposure to other viral communicable diseases: Secondary | ICD-10-CM | POA: Diagnosis not present

## 2020-02-12 DIAGNOSIS — R059 Cough, unspecified: Secondary | ICD-10-CM | POA: Diagnosis not present

## 2020-02-18 DIAGNOSIS — R059 Cough, unspecified: Secondary | ICD-10-CM | POA: Diagnosis not present

## 2020-02-18 DIAGNOSIS — Z20828 Contact with and (suspected) exposure to other viral communicable diseases: Secondary | ICD-10-CM | POA: Diagnosis not present

## 2020-02-19 DIAGNOSIS — Z20828 Contact with and (suspected) exposure to other viral communicable diseases: Secondary | ICD-10-CM | POA: Diagnosis not present

## 2020-02-19 DIAGNOSIS — R059 Cough, unspecified: Secondary | ICD-10-CM | POA: Diagnosis not present

## 2020-02-25 DIAGNOSIS — Z20828 Contact with and (suspected) exposure to other viral communicable diseases: Secondary | ICD-10-CM | POA: Diagnosis not present

## 2020-02-25 DIAGNOSIS — R059 Cough, unspecified: Secondary | ICD-10-CM | POA: Diagnosis not present

## 2020-02-26 DIAGNOSIS — R059 Cough, unspecified: Secondary | ICD-10-CM | POA: Diagnosis not present

## 2020-02-26 DIAGNOSIS — Z20828 Contact with and (suspected) exposure to other viral communicable diseases: Secondary | ICD-10-CM | POA: Diagnosis not present

## 2020-02-28 DIAGNOSIS — Z20828 Contact with and (suspected) exposure to other viral communicable diseases: Secondary | ICD-10-CM | POA: Diagnosis not present

## 2020-02-29 DIAGNOSIS — Z20828 Contact with and (suspected) exposure to other viral communicable diseases: Secondary | ICD-10-CM | POA: Diagnosis not present

## 2020-03-13 DIAGNOSIS — Z20828 Contact with and (suspected) exposure to other viral communicable diseases: Secondary | ICD-10-CM | POA: Diagnosis not present

## 2020-03-14 DIAGNOSIS — Z20828 Contact with and (suspected) exposure to other viral communicable diseases: Secondary | ICD-10-CM | POA: Diagnosis not present

## 2020-03-24 DIAGNOSIS — Z20828 Contact with and (suspected) exposure to other viral communicable diseases: Secondary | ICD-10-CM | POA: Diagnosis not present

## 2020-03-25 DIAGNOSIS — Z20828 Contact with and (suspected) exposure to other viral communicable diseases: Secondary | ICD-10-CM | POA: Diagnosis not present

## 2020-04-03 DIAGNOSIS — Z20828 Contact with and (suspected) exposure to other viral communicable diseases: Secondary | ICD-10-CM | POA: Diagnosis not present

## 2020-04-07 DIAGNOSIS — Z20828 Contact with and (suspected) exposure to other viral communicable diseases: Secondary | ICD-10-CM | POA: Diagnosis not present

## 2020-04-12 DIAGNOSIS — Z20828 Contact with and (suspected) exposure to other viral communicable diseases: Secondary | ICD-10-CM | POA: Diagnosis not present

## 2020-04-13 DIAGNOSIS — Z20828 Contact with and (suspected) exposure to other viral communicable diseases: Secondary | ICD-10-CM | POA: Diagnosis not present

## 2020-04-24 DIAGNOSIS — Z20828 Contact with and (suspected) exposure to other viral communicable diseases: Secondary | ICD-10-CM | POA: Diagnosis not present

## 2020-04-25 DIAGNOSIS — Z20828 Contact with and (suspected) exposure to other viral communicable diseases: Secondary | ICD-10-CM | POA: Diagnosis not present

## 2020-05-01 DIAGNOSIS — Z20828 Contact with and (suspected) exposure to other viral communicable diseases: Secondary | ICD-10-CM | POA: Diagnosis not present

## 2020-05-02 DIAGNOSIS — R059 Cough, unspecified: Secondary | ICD-10-CM | POA: Diagnosis not present

## 2020-05-02 DIAGNOSIS — R0602 Shortness of breath: Secondary | ICD-10-CM | POA: Diagnosis not present

## 2020-05-08 DIAGNOSIS — Z20828 Contact with and (suspected) exposure to other viral communicable diseases: Secondary | ICD-10-CM | POA: Diagnosis not present

## 2020-05-15 DIAGNOSIS — Z20828 Contact with and (suspected) exposure to other viral communicable diseases: Secondary | ICD-10-CM | POA: Diagnosis not present

## 2020-05-23 ENCOUNTER — Other Ambulatory Visit: Payer: Self-pay | Admitting: Student

## 2020-05-23 ENCOUNTER — Ambulatory Visit
Admission: RE | Admit: 2020-05-23 | Discharge: 2020-05-23 | Disposition: A | Discharge status: Home / Self Care | Payer: Medicare Other | Source: Ambulatory Visit | Attending: Student | Admitting: Student

## 2020-05-23 DIAGNOSIS — R609 Edema, unspecified: Secondary | ICD-10-CM

## 2020-05-23 DIAGNOSIS — T148XXA Other injury of unspecified body region, initial encounter: Secondary | ICD-10-CM

## 2020-05-23 DIAGNOSIS — M7989 Other specified soft tissue disorders: Secondary | ICD-10-CM | POA: Diagnosis not present

## 2020-05-23 DIAGNOSIS — M79672 Pain in left foot: Secondary | ICD-10-CM | POA: Diagnosis not present

## 2020-05-23 DIAGNOSIS — M25572 Pain in left ankle and joints of left foot: Secondary | ICD-10-CM | POA: Diagnosis not present

## 2020-05-23 DIAGNOSIS — S82832A Other fracture of upper and lower end of left fibula, initial encounter for closed fracture: Secondary | ICD-10-CM | POA: Diagnosis not present

## 2020-05-24 DIAGNOSIS — S82442A Displaced spiral fracture of shaft of left fibula, initial encounter for closed fracture: Secondary | ICD-10-CM | POA: Diagnosis not present

## 2020-05-29 DIAGNOSIS — S82442A Displaced spiral fracture of shaft of left fibula, initial encounter for closed fracture: Secondary | ICD-10-CM | POA: Diagnosis not present

## 2020-06-05 DIAGNOSIS — Z20828 Contact with and (suspected) exposure to other viral communicable diseases: Secondary | ICD-10-CM | POA: Diagnosis not present

## 2020-06-06 DIAGNOSIS — Z20828 Contact with and (suspected) exposure to other viral communicable diseases: Secondary | ICD-10-CM | POA: Diagnosis not present

## 2020-06-12 DIAGNOSIS — Z20828 Contact with and (suspected) exposure to other viral communicable diseases: Secondary | ICD-10-CM | POA: Diagnosis not present

## 2020-06-13 DIAGNOSIS — Z20828 Contact with and (suspected) exposure to other viral communicable diseases: Secondary | ICD-10-CM | POA: Diagnosis not present

## 2020-06-19 DIAGNOSIS — Z20828 Contact with and (suspected) exposure to other viral communicable diseases: Secondary | ICD-10-CM | POA: Diagnosis not present

## 2020-06-20 DIAGNOSIS — Z20828 Contact with and (suspected) exposure to other viral communicable diseases: Secondary | ICD-10-CM | POA: Diagnosis not present

## 2020-06-26 DIAGNOSIS — S82442A Displaced spiral fracture of shaft of left fibula, initial encounter for closed fracture: Secondary | ICD-10-CM | POA: Diagnosis not present

## 2020-07-03 DIAGNOSIS — Z20828 Contact with and (suspected) exposure to other viral communicable diseases: Secondary | ICD-10-CM | POA: Diagnosis not present

## 2020-07-04 DIAGNOSIS — Z20828 Contact with and (suspected) exposure to other viral communicable diseases: Secondary | ICD-10-CM | POA: Diagnosis not present

## 2020-07-10 DIAGNOSIS — Z20828 Contact with and (suspected) exposure to other viral communicable diseases: Secondary | ICD-10-CM | POA: Diagnosis not present

## 2020-07-10 DIAGNOSIS — R059 Cough, unspecified: Secondary | ICD-10-CM | POA: Diagnosis not present

## 2020-07-11 DIAGNOSIS — R059 Cough, unspecified: Secondary | ICD-10-CM | POA: Diagnosis not present

## 2020-07-11 DIAGNOSIS — Z20828 Contact with and (suspected) exposure to other viral communicable diseases: Secondary | ICD-10-CM | POA: Diagnosis not present

## 2020-07-17 DIAGNOSIS — R059 Cough, unspecified: Secondary | ICD-10-CM | POA: Diagnosis not present

## 2020-07-17 DIAGNOSIS — Z20828 Contact with and (suspected) exposure to other viral communicable diseases: Secondary | ICD-10-CM | POA: Diagnosis not present

## 2020-07-18 DIAGNOSIS — Z20828 Contact with and (suspected) exposure to other viral communicable diseases: Secondary | ICD-10-CM | POA: Diagnosis not present

## 2020-07-18 DIAGNOSIS — R059 Cough, unspecified: Secondary | ICD-10-CM | POA: Diagnosis not present

## 2020-07-24 DIAGNOSIS — Z20828 Contact with and (suspected) exposure to other viral communicable diseases: Secondary | ICD-10-CM | POA: Diagnosis not present

## 2020-07-24 DIAGNOSIS — S82442D Displaced spiral fracture of shaft of left fibula, subsequent encounter for closed fracture with routine healing: Secondary | ICD-10-CM | POA: Diagnosis not present

## 2020-07-31 DIAGNOSIS — R059 Cough, unspecified: Secondary | ICD-10-CM | POA: Diagnosis not present

## 2020-07-31 DIAGNOSIS — Z20828 Contact with and (suspected) exposure to other viral communicable diseases: Secondary | ICD-10-CM | POA: Diagnosis not present

## 2020-08-01 DIAGNOSIS — Z20828 Contact with and (suspected) exposure to other viral communicable diseases: Secondary | ICD-10-CM | POA: Diagnosis not present

## 2020-08-01 DIAGNOSIS — R059 Cough, unspecified: Secondary | ICD-10-CM | POA: Diagnosis not present

## 2020-08-08 DIAGNOSIS — Z20828 Contact with and (suspected) exposure to other viral communicable diseases: Secondary | ICD-10-CM | POA: Diagnosis not present

## 2020-08-08 DIAGNOSIS — R059 Cough, unspecified: Secondary | ICD-10-CM | POA: Diagnosis not present

## 2020-08-09 DIAGNOSIS — R059 Cough, unspecified: Secondary | ICD-10-CM | POA: Diagnosis not present

## 2020-08-09 DIAGNOSIS — Z20828 Contact with and (suspected) exposure to other viral communicable diseases: Secondary | ICD-10-CM | POA: Diagnosis not present

## 2020-08-15 DIAGNOSIS — Z20828 Contact with and (suspected) exposure to other viral communicable diseases: Secondary | ICD-10-CM | POA: Diagnosis not present

## 2020-08-16 DIAGNOSIS — Z20828 Contact with and (suspected) exposure to other viral communicable diseases: Secondary | ICD-10-CM | POA: Diagnosis not present

## 2020-08-21 DIAGNOSIS — R059 Cough, unspecified: Secondary | ICD-10-CM | POA: Diagnosis not present

## 2020-08-21 DIAGNOSIS — Z20828 Contact with and (suspected) exposure to other viral communicable diseases: Secondary | ICD-10-CM | POA: Diagnosis not present

## 2020-08-22 DIAGNOSIS — R059 Cough, unspecified: Secondary | ICD-10-CM | POA: Diagnosis not present

## 2020-08-22 DIAGNOSIS — Z20828 Contact with and (suspected) exposure to other viral communicable diseases: Secondary | ICD-10-CM | POA: Diagnosis not present

## 2020-09-07 DIAGNOSIS — R350 Frequency of micturition: Secondary | ICD-10-CM | POA: Diagnosis not present

## 2020-12-31 DIAGNOSIS — M62830 Muscle spasm of back: Secondary | ICD-10-CM | POA: Diagnosis not present

## 2020-12-31 DIAGNOSIS — R350 Frequency of micturition: Secondary | ICD-10-CM | POA: Diagnosis not present

## 2020-12-31 DIAGNOSIS — S39012A Strain of muscle, fascia and tendon of lower back, initial encounter: Secondary | ICD-10-CM | POA: Diagnosis not present

## 2021-02-19 DIAGNOSIS — J069 Acute upper respiratory infection, unspecified: Secondary | ICD-10-CM | POA: Diagnosis not present

## 2021-04-03 ENCOUNTER — Other Ambulatory Visit: Payer: Self-pay

## 2021-04-03 ENCOUNTER — Emergency Department (HOSPITAL_COMMUNITY)
Admission: EM | Admit: 2021-04-03 | Discharge: 2021-04-03 | Disposition: A | Discharge status: Home / Self Care | Payer: Medicare Other | Attending: Emergency Medicine | Admitting: Emergency Medicine

## 2021-04-03 ENCOUNTER — Encounter (HOSPITAL_COMMUNITY): Payer: Self-pay

## 2021-04-03 DIAGNOSIS — R42 Dizziness and giddiness: Secondary | ICD-10-CM

## 2021-04-03 DIAGNOSIS — F17210 Nicotine dependence, cigarettes, uncomplicated: Secondary | ICD-10-CM | POA: Diagnosis not present

## 2021-04-03 DIAGNOSIS — Z743 Need for continuous supervision: Secondary | ICD-10-CM | POA: Diagnosis not present

## 2021-04-03 DIAGNOSIS — R9431 Abnormal electrocardiogram [ECG] [EKG]: Secondary | ICD-10-CM | POA: Diagnosis not present

## 2021-04-03 DIAGNOSIS — R0602 Shortness of breath: Secondary | ICD-10-CM | POA: Insufficient documentation

## 2021-04-03 LAB — COMPREHENSIVE METABOLIC PANEL
ALT: 17 U/L (ref 0–44)
AST: 23 U/L (ref 15–41)
Albumin: 4.4 g/dL (ref 3.5–5.0)
Alkaline Phosphatase: 53 U/L (ref 38–126)
Anion gap: 8 (ref 5–15)
BUN: 8 mg/dL (ref 6–20)
CO2: 28 mmol/L (ref 22–32)
Calcium: 8.9 mg/dL (ref 8.9–10.3)
Chloride: 98 mmol/L (ref 98–111)
Creatinine, Ser: 0.62 mg/dL (ref 0.61–1.24)
GFR, Estimated: >60 mL/min (ref >60)
Glucose, Bld: 103 mg/dL — ABNORMAL HIGH (ref 70–99)
Potassium: 4.1 mmol/L (ref 3.5–5.1)
Sodium: 134 mmol/L — ABNORMAL LOW (ref 135–145)
Total Bilirubin: 1 mg/dL (ref 0.3–1.2)
Total Protein: 7.4 g/dL (ref 6.5–8.1)

## 2021-04-03 LAB — CBC WITH DIFFERENTIAL/PLATELET
Abs Immature Granulocytes: 0.02 10*3/uL (ref 0.00–0.07)
Basophils Absolute: 0 10*3/uL (ref 0.0–0.1)
Basophils Relative: 0 %
Eosinophils Absolute: 0.1 10*3/uL (ref 0.0–0.5)
Eosinophils Relative: 1 %
HCT: 42.4 % (ref 39.0–52.0)
Hemoglobin: 14.6 g/dL (ref 13.0–17.0)
Immature Granulocytes: 0 %
Lymphocytes Relative: 31 %
Lymphs Abs: 2.6 10*3/uL (ref 0.7–4.0)
MCH: 32.7 pg (ref 26.0–34.0)
MCHC: 34.4 g/dL (ref 30.0–36.0)
MCV: 94.9 fL (ref 80.0–100.0)
Monocytes Absolute: 0.9 10*3/uL (ref 0.1–1.0)
Monocytes Relative: 11 %
Neutro Abs: 4.6 10*3/uL (ref 1.7–7.7)
Neutrophils Relative %: 57 %
Platelets: 258 10*3/uL (ref 150–400)
RBC: 4.47 MIL/uL (ref 4.22–5.81)
RDW: 13.7 % (ref 11.5–15.5)
WBC: 8.3 10*3/uL (ref 4.0–10.5)
nRBC: 0 % (ref 0.0–0.2)

## 2021-04-03 NOTE — Discharge Instructions (Signed)
You were seen in the emergency department for evaluation of an episode of dizziness.  You were not having any symptoms here and your lab work and EKG were unremarkable.  Please stay well-hydrated and follow-up with your primary care doctor.  Return to the emergency department if any worsening or concerning symptoms.

## 2021-04-03 NOTE — ED Provider Notes (Signed)
Henry Roberts Provider Note   CSN: [id number] Arrival date & time: 04/03/21  1108     History  No chief complaint on file.   Henry Roberts is a 56 y.o. male.  He is here with a complaint of dizziness.  He said he had just returned from having a cigarette on break and was acutely dizzy.  He felt unbalanced and also lightheaded.  It lasted about 15 minutes and is completely resolved.  He said he has had this before and was related to an inner ear problem.  He did say he smoked and drank a lot over the weekend but otherwise feels he is well-hydrated.  No headache.  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  No numbness or weakness.  No chest pain.  He feels this shortness of breath is at baseline and attributes this to his smoking.  The history is provided by the patient.  Dizziness Quality:  Lightheadedness and imbalance Severity:  Moderate Onset quality:  Sudden Duration: 15 minutes. Progression:  Resolved Chronicity:  Recurrent Context: physical activity   Relieved by:  Lying down Worsened by:  Nothing Ineffective treatments:  None tried Associated symptoms: shortness of breath   Associated symptoms: no chest pain, no headaches, no hearing loss, no nausea, no syncope, no vision changes, no vomiting and no weakness   Risk factors: no multiple medications and no new medications       Home Medications Prior to Admission medications   Medication Sig Start Date End Date Taking? Authorizing Provider  Ascorbic Acid (VITAMIN C) 1000 MG tablet Take 1,000 mg by mouth daily.    [provider]  benzonatate (TESSALON PERLES) 100 MG capsule Take 1 capsule (100 mg total) by mouth 3 (three) times daily as needed. 06/10/18   Evelina Dun A, FNP  Cholecalciferol (HM VITAMIN D3) 100 MCG (4000 UT) CAPS Take 4,000 Units by mouth daily.     [provider]  Pseudoephedrine-APAP-DM (DAYQUIL MULTI-SYMPTOM PO) Take 1 capsule by mouth  daily as needed (congestion).    [provider]  Zinc 30 MG CAPS Take 30 mg by mouth daily.    [provider]      Allergies    Patient has no known allergies.    Review of Systems   Review of Systems  Constitutional:  Negative for fever.  HENT:  Negative for hearing loss.   Eyes:  Positive for visual disturbance.  Respiratory:  Positive for shortness of breath.   Cardiovascular:  Negative for chest pain and syncope.  Gastrointestinal:  Negative for nausea and vomiting.  Genitourinary:  Negative for dysuria.  Musculoskeletal:  Negative for neck pain.  Skin:  Negative for rash.  Neurological:  Positive for dizziness and light-headedness. Negative for weakness and headaches.   Physical Exam Updated Vital Signs BP 132/76    Pulse (!) 59    Temp 98.1 F (36.7 C) (Oral)    Resp 16    Ht 5\' 6"  (1.676 m)    Wt 74.8 kg    SpO2 97%    BMI 26.63 kg/m  Physical Exam Vitals and nursing note reviewed.  Constitutional:      General: He is not in acute distress.    Appearance: Normal appearance. He is well-developed.  HENT:     Head: Normocephalic and atraumatic.     Right Ear: Tympanic membrane normal.     Left Ear: Tympanic membrane normal.  Eyes:     Conjunctiva/sclera:  Conjunctivae normal.  Cardiovascular:     Rate and Rhythm: Normal rate and regular rhythm.     Heart sounds: No murmur heard. Pulmonary:     Effort: Pulmonary effort is normal. No respiratory distress.     Breath sounds: Normal breath sounds.  Abdominal:     Palpations: Abdomen is soft.     Tenderness: There is no abdominal tenderness.  Musculoskeletal:        General: No swelling.     Cervical back: Neck supple.     Right lower leg: No edema.     Left lower leg: No edema.  Skin:    General: Skin is warm and dry.     Capillary Refill: Capillary refill takes less than 2 seconds.  Neurological:     General: No focal deficit present.     Mental Status: He is alert.    ED Results /  Procedures / Treatments   Labs (all labs ordered are listed, but only abnormal results are displayed) Labs Reviewed  COMPREHENSIVE METABOLIC PANEL - Abnormal; Notable for the following components:      Result Value   Sodium 134 (*)    Glucose, Bld 103 (*)    All other components within normal limits  CBC WITH DIFFERENTIAL/PLATELET    EKG EKG Interpretation  Date/Time:  Tuesday April 03 2021 11:18:03 EST Ventricular Rate:  69 PR Interval:  157 QRS Duration: 99 QT Interval:  400 QTC Calculation: 429 R Axis:   3 Text Interpretation: Sinus rhythm Probable anteroseptal infarct, old No significant change since last tracing Confirmed by Calvert Cantor [phone]) on 04/03/2021 12:57:32 PM  Radiology No results found.  Procedures Procedures    Medications Ordered in ED Medications - No data to display  ED Course/ Medical Decision Making/ A&P Clinical Course as of 04/04/21 0941  Tue Apr 03, 2021  1850 Patient continues to feel well here her vitals have been good.  He is comfortable plan for discharge.  Recommended close follow-up with his PCP.  Return instructions discussed [MB]    Clinical Course User Index [MB] Hayden Rasmussen, MD                           Medical Decision Making Amount and/or Complexity of Data Reviewed Labs: ordered. ECG/medicine tests: ordered.  This patient complains of dizziness lightheadedness; this involves an extensive number of treatment Options and is a complaint that carries with it a high risk of complications and Morbidity. The differential includes vertigo, dehydration, metabolic derangement, stroke, arrhythmia  I ordered, reviewed and interpreted labs, which included CBC with normal white count normal hemoglobin, chemistries fairly unremarkable, LFTs normal  Previous records obtained and reviewed no recent admissions  After the interventions stated above, I reevaluated the patient and found patient to be asymptomatic and has been slow  for multiple hours.  He is up and ambulatory in the department without any difficulty.  Reviewed results of work-up with him.  No indications for admission at this time.  Close follow-up with his primary care doctor and return instructions discussed          Final Clinical Impression(s) / ED Diagnoses Final diagnoses:  Dizziness    Rx / DC Orders ED Discharge Orders     None         Hayden Rasmussen, MD 04/04/21 [phone]

## 2021-04-03 NOTE — ED Triage Notes (Signed)
Patient coming from work-became dizzy-states the last time this happened it was due to an ear infection-states he normally drinks and smokes heavily on the weekend so he has an increase in coughing-chronic cough due to COPD

## 2021-04-03 NOTE — ED Provider Notes (Signed)
MSE note.  Patient complains of some dizziness that lasted for about 15 minutes.  Patient states she has had inner ear problems before.  Patient has no dizziness now.  Labs have been ordered along with an EKG.  Patient in no acute distress   Bethann Berkshire, MD 04/03/21 1148

## 2021-04-24 DIAGNOSIS — Z20822 Contact with and (suspected) exposure to covid-19: Secondary | ICD-10-CM | POA: Diagnosis not present

## 2021-05-30 DIAGNOSIS — H35033 Hypertensive retinopathy, bilateral: Secondary | ICD-10-CM | POA: Diagnosis not present

## 2021-05-30 DIAGNOSIS — Z961 Presence of intraocular lens: Secondary | ICD-10-CM | POA: Diagnosis not present

## 2021-05-30 DIAGNOSIS — H04123 Dry eye syndrome of bilateral lacrimal glands: Secondary | ICD-10-CM | POA: Diagnosis not present

## 2021-05-30 DIAGNOSIS — H3552 Pigmentary retinal dystrophy: Secondary | ICD-10-CM | POA: Diagnosis not present

## 2021-10-01 DIAGNOSIS — S90921A Unspecified superficial injury of right foot, initial encounter: Secondary | ICD-10-CM | POA: Diagnosis not present

## 2021-11-22 DIAGNOSIS — F17209 Nicotine dependence, unspecified, with unspecified nicotine-induced disorders: Secondary | ICD-10-CM | POA: Diagnosis not present

## 2021-11-22 DIAGNOSIS — J069 Acute upper respiratory infection, unspecified: Secondary | ICD-10-CM | POA: Diagnosis not present

## 2021-12-19 DIAGNOSIS — J0100 Acute maxillary sinusitis, unspecified: Secondary | ICD-10-CM | POA: Diagnosis not present

## 2022-02-06 DIAGNOSIS — E785 Hyperlipidemia, unspecified: Secondary | ICD-10-CM | POA: Diagnosis not present

## 2022-02-06 DIAGNOSIS — Z125 Encounter for screening for malignant neoplasm of prostate: Secondary | ICD-10-CM | POA: Diagnosis not present

## 2022-02-06 DIAGNOSIS — J449 Chronic obstructive pulmonary disease, unspecified: Secondary | ICD-10-CM | POA: Diagnosis not present

## 2022-02-06 DIAGNOSIS — Z0000 Encounter for general adult medical examination without abnormal findings: Secondary | ICD-10-CM | POA: Diagnosis not present

## 2022-02-06 DIAGNOSIS — R739 Hyperglycemia, unspecified: Secondary | ICD-10-CM | POA: Diagnosis not present

## 2022-02-06 DIAGNOSIS — F17200 Nicotine dependence, unspecified, uncomplicated: Secondary | ICD-10-CM | POA: Diagnosis not present

## 2022-02-07 ENCOUNTER — Other Ambulatory Visit: Payer: Self-pay | Admitting: Family Medicine

## 2022-02-07 DIAGNOSIS — F17200 Nicotine dependence, unspecified, uncomplicated: Secondary | ICD-10-CM

## 2022-03-12 ENCOUNTER — Other Ambulatory Visit: Payer: Medicare Other

## 2022-03-15 ENCOUNTER — Ambulatory Visit
Admission: RE | Admit: 2022-03-15 | Discharge: 2022-03-15 | Disposition: A | Discharge status: Home / Self Care | Payer: Medicare Other | Source: Ambulatory Visit | Attending: Family Medicine | Admitting: Family Medicine

## 2022-03-15 DIAGNOSIS — Z87891 Personal history of nicotine dependence: Secondary | ICD-10-CM | POA: Diagnosis not present

## 2022-03-15 DIAGNOSIS — F17200 Nicotine dependence, unspecified, uncomplicated: Secondary | ICD-10-CM

## 2022-03-15 DIAGNOSIS — I700 Atherosclerosis of aorta: Secondary | ICD-10-CM | POA: Diagnosis not present

## 2022-03-15 DIAGNOSIS — R911 Solitary pulmonary nodule: Secondary | ICD-10-CM | POA: Diagnosis not present

## 2022-03-15 DIAGNOSIS — J432 Centrilobular emphysema: Secondary | ICD-10-CM | POA: Diagnosis not present

## 2022-04-04 DIAGNOSIS — F17210 Nicotine dependence, cigarettes, uncomplicated: Secondary | ICD-10-CM | POA: Diagnosis not present

## 2022-04-04 DIAGNOSIS — Z720 Tobacco use: Secondary | ICD-10-CM | POA: Insufficient documentation

## 2022-04-04 DIAGNOSIS — Z79899 Other long term (current) drug therapy: Secondary | ICD-10-CM | POA: Diagnosis not present

## 2022-04-04 DIAGNOSIS — R911 Solitary pulmonary nodule: Secondary | ICD-10-CM | POA: Insufficient documentation

## 2022-04-04 DIAGNOSIS — J449 Chronic obstructive pulmonary disease, unspecified: Secondary | ICD-10-CM | POA: Diagnosis not present

## 2022-04-11 ENCOUNTER — Institutional Professional Consult (permissible substitution): Payer: Medicare Other | Admitting: Pulmonary Disease

## 2022-04-12 DIAGNOSIS — I700 Atherosclerosis of aorta: Secondary | ICD-10-CM | POA: Diagnosis not present

## 2022-04-12 DIAGNOSIS — R911 Solitary pulmonary nodule: Secondary | ICD-10-CM | POA: Diagnosis not present

## 2022-04-12 DIAGNOSIS — R7309 Other abnormal glucose: Secondary | ICD-10-CM | POA: Diagnosis not present

## 2022-05-02 DIAGNOSIS — Z720 Tobacco use: Secondary | ICD-10-CM | POA: Diagnosis not present

## 2022-05-02 DIAGNOSIS — J449 Chronic obstructive pulmonary disease, unspecified: Secondary | ICD-10-CM | POA: Diagnosis not present

## 2022-05-02 DIAGNOSIS — R911 Solitary pulmonary nodule: Secondary | ICD-10-CM | POA: Diagnosis not present

## 2022-05-03 DIAGNOSIS — F17210 Nicotine dependence, cigarettes, uncomplicated: Secondary | ICD-10-CM | POA: Diagnosis not present

## 2022-05-03 DIAGNOSIS — J984 Other disorders of lung: Secondary | ICD-10-CM | POA: Diagnosis not present

## 2022-05-10 DIAGNOSIS — R911 Solitary pulmonary nodule: Secondary | ICD-10-CM | POA: Diagnosis not present

## 2022-05-20 DIAGNOSIS — H35033 Hypertensive retinopathy, bilateral: Secondary | ICD-10-CM | POA: Diagnosis not present

## 2022-05-20 DIAGNOSIS — H524 Presbyopia: Secondary | ICD-10-CM | POA: Diagnosis not present

## 2022-05-20 DIAGNOSIS — H04123 Dry eye syndrome of bilateral lacrimal glands: Secondary | ICD-10-CM | POA: Diagnosis not present

## 2022-05-20 DIAGNOSIS — H3552 Pigmentary retinal dystrophy: Secondary | ICD-10-CM | POA: Diagnosis not present

## 2022-05-23 DIAGNOSIS — R52 Pain, unspecified: Secondary | ICD-10-CM | POA: Diagnosis not present

## 2022-05-23 DIAGNOSIS — J439 Emphysema, unspecified: Secondary | ICD-10-CM | POA: Diagnosis not present

## 2022-05-23 DIAGNOSIS — R051 Acute cough: Secondary | ICD-10-CM | POA: Diagnosis not present

## 2022-05-23 DIAGNOSIS — Z03818 Encounter for observation for suspected exposure to other biological agents ruled out: Secondary | ICD-10-CM | POA: Diagnosis not present

## 2022-05-23 DIAGNOSIS — R062 Wheezing: Secondary | ICD-10-CM | POA: Diagnosis not present

## 2022-05-23 DIAGNOSIS — R5383 Other fatigue: Secondary | ICD-10-CM | POA: Diagnosis not present

## 2022-05-23 DIAGNOSIS — J029 Acute pharyngitis, unspecified: Secondary | ICD-10-CM | POA: Diagnosis not present

## 2022-06-01 IMAGING — DX DG ANKLE COMPLETE 3+V*L*
3 series · 3 of 3 positions shown · non-contrast
Comparison: None.

CLINICAL DATA: Acute left ankle pain and swelling after fall 3 days
ago.

EXAM:
LEFT ANKLE COMPLETE - 3+ VIEW

[dg ankle complete left (1 of 3)]
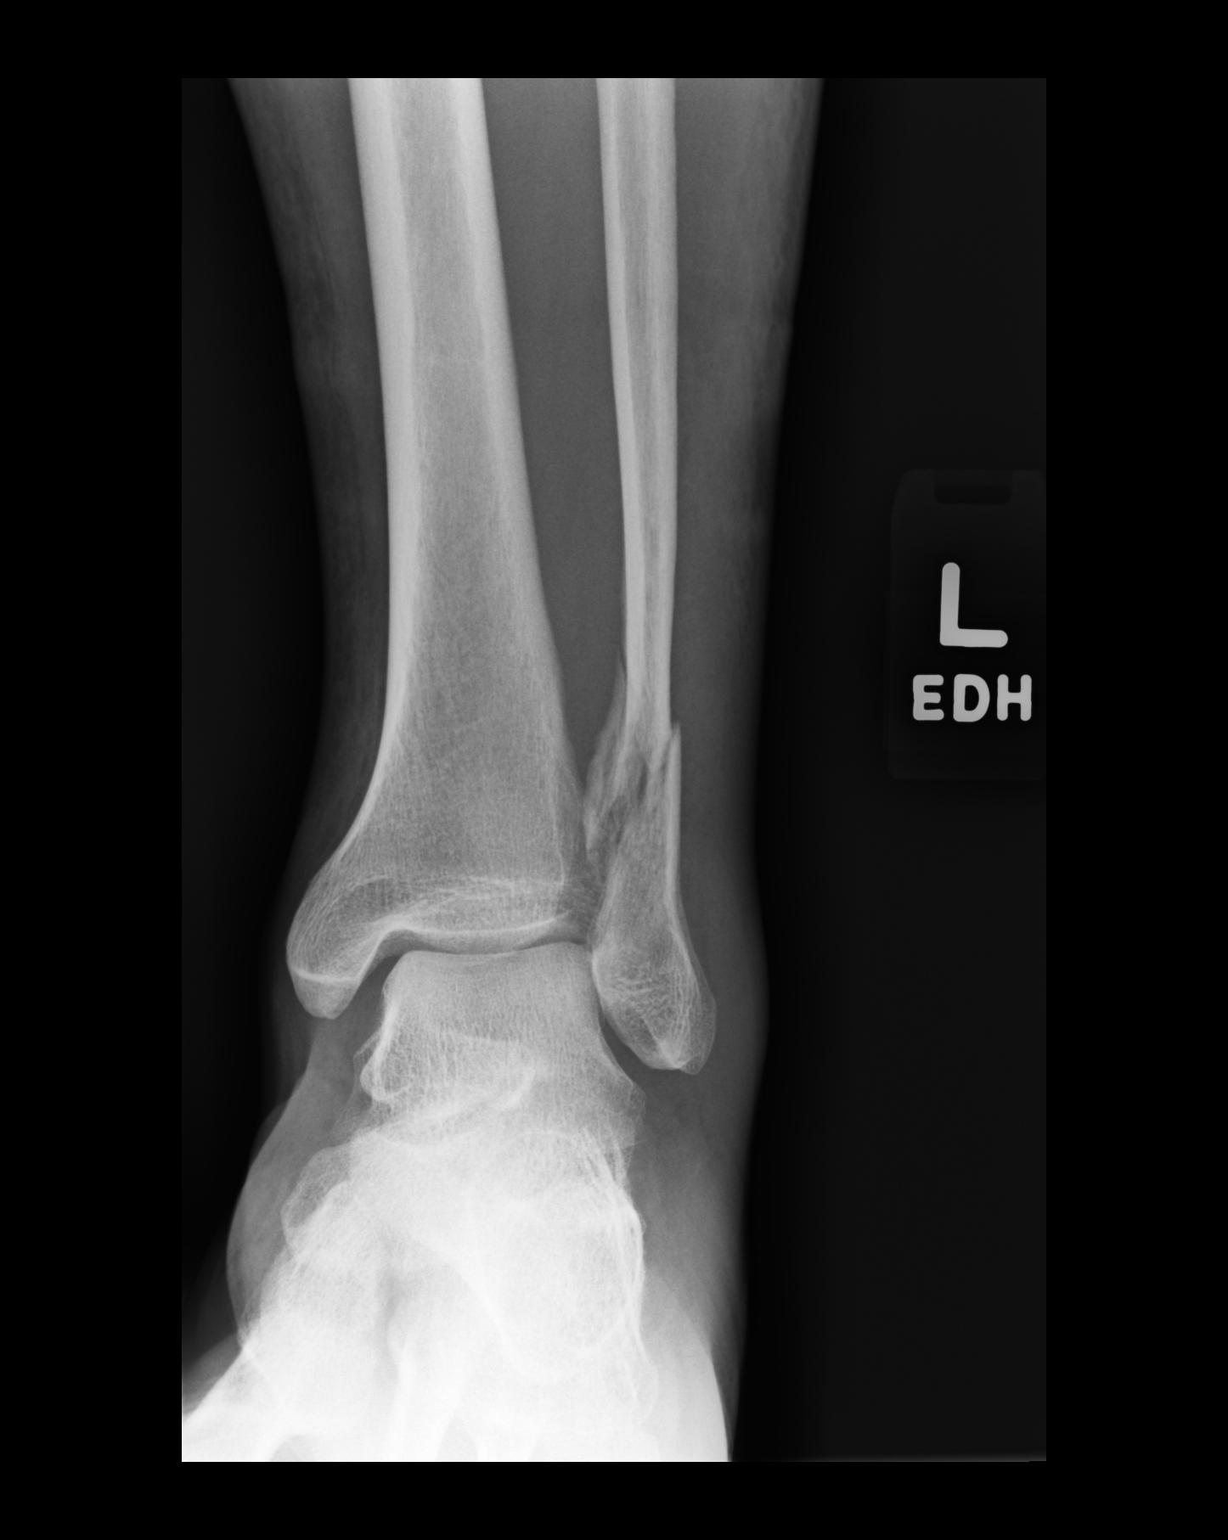

[dg ankle complete left (2 of 3)]
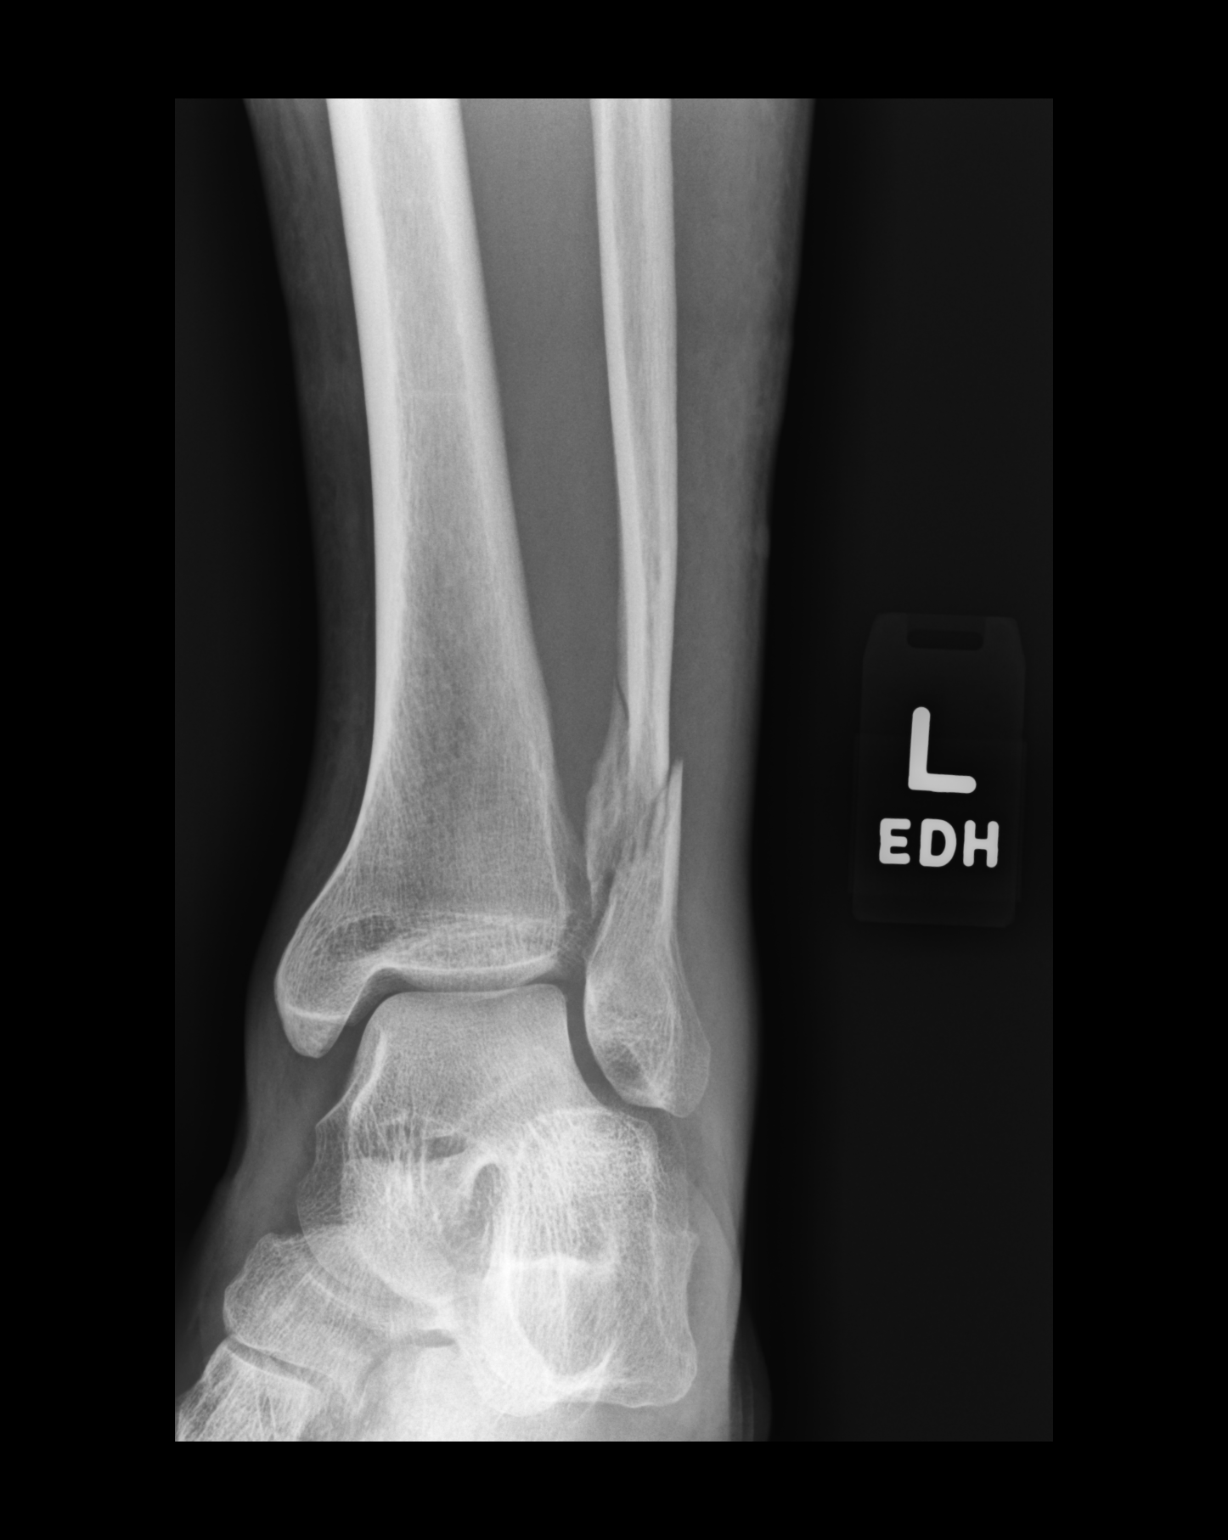

[dg ankle complete left (3 of 3)]
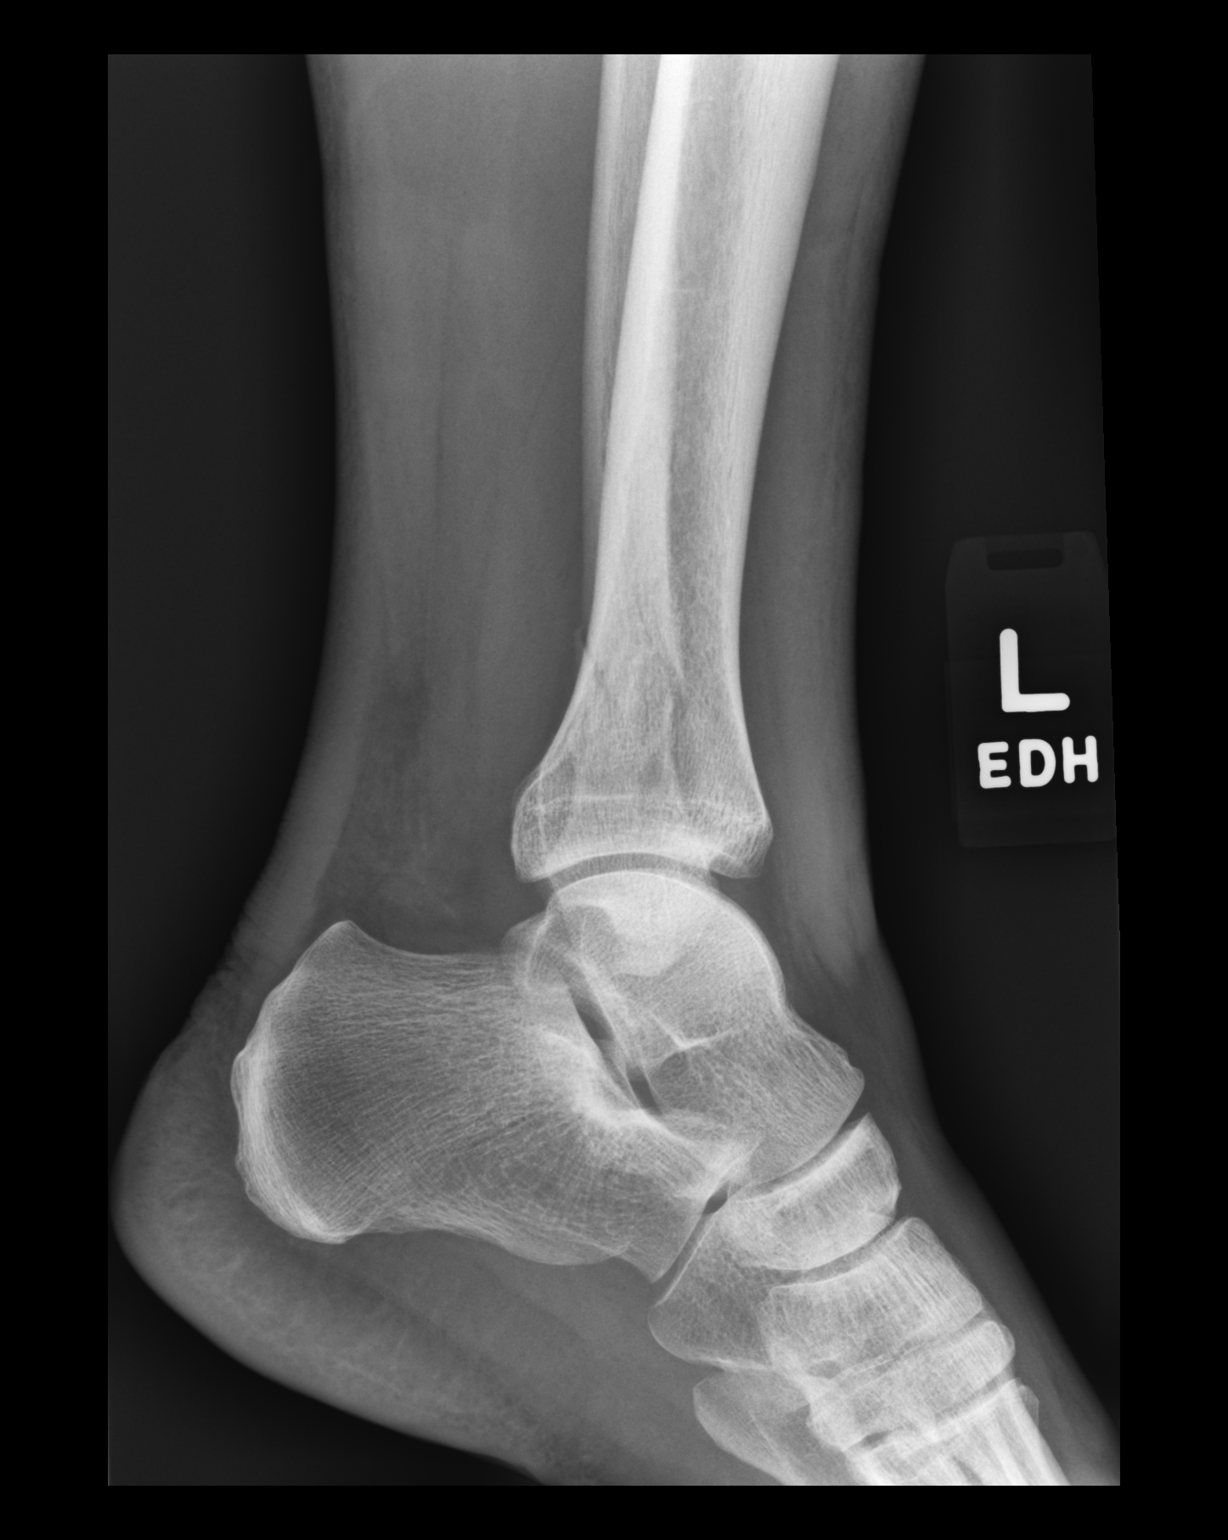

[3 of 3 positions shown; findings below may reference images not displayed]

FINDINGS: Mildly displaced and comminuted fracture is seen involving the
distal left fibula. Overlying soft tissue swelling is noted. The
tibia is unremarkable.
IMPRESSION: Mildly displaced and comminuted distal left fibular fracture.

## 2022-06-01 IMAGING — DX DG FOOT COMPLETE 3+V*L*
3 series · 3 of 3 positions shown · non-contrast
Comparison: None.

CLINICAL DATA: Acute left foot pain after fall 3 days ago.

EXAM:
LEFT FOOT - COMPLETE 3+ VIEW

[dg foot complete left (1 of 3)]
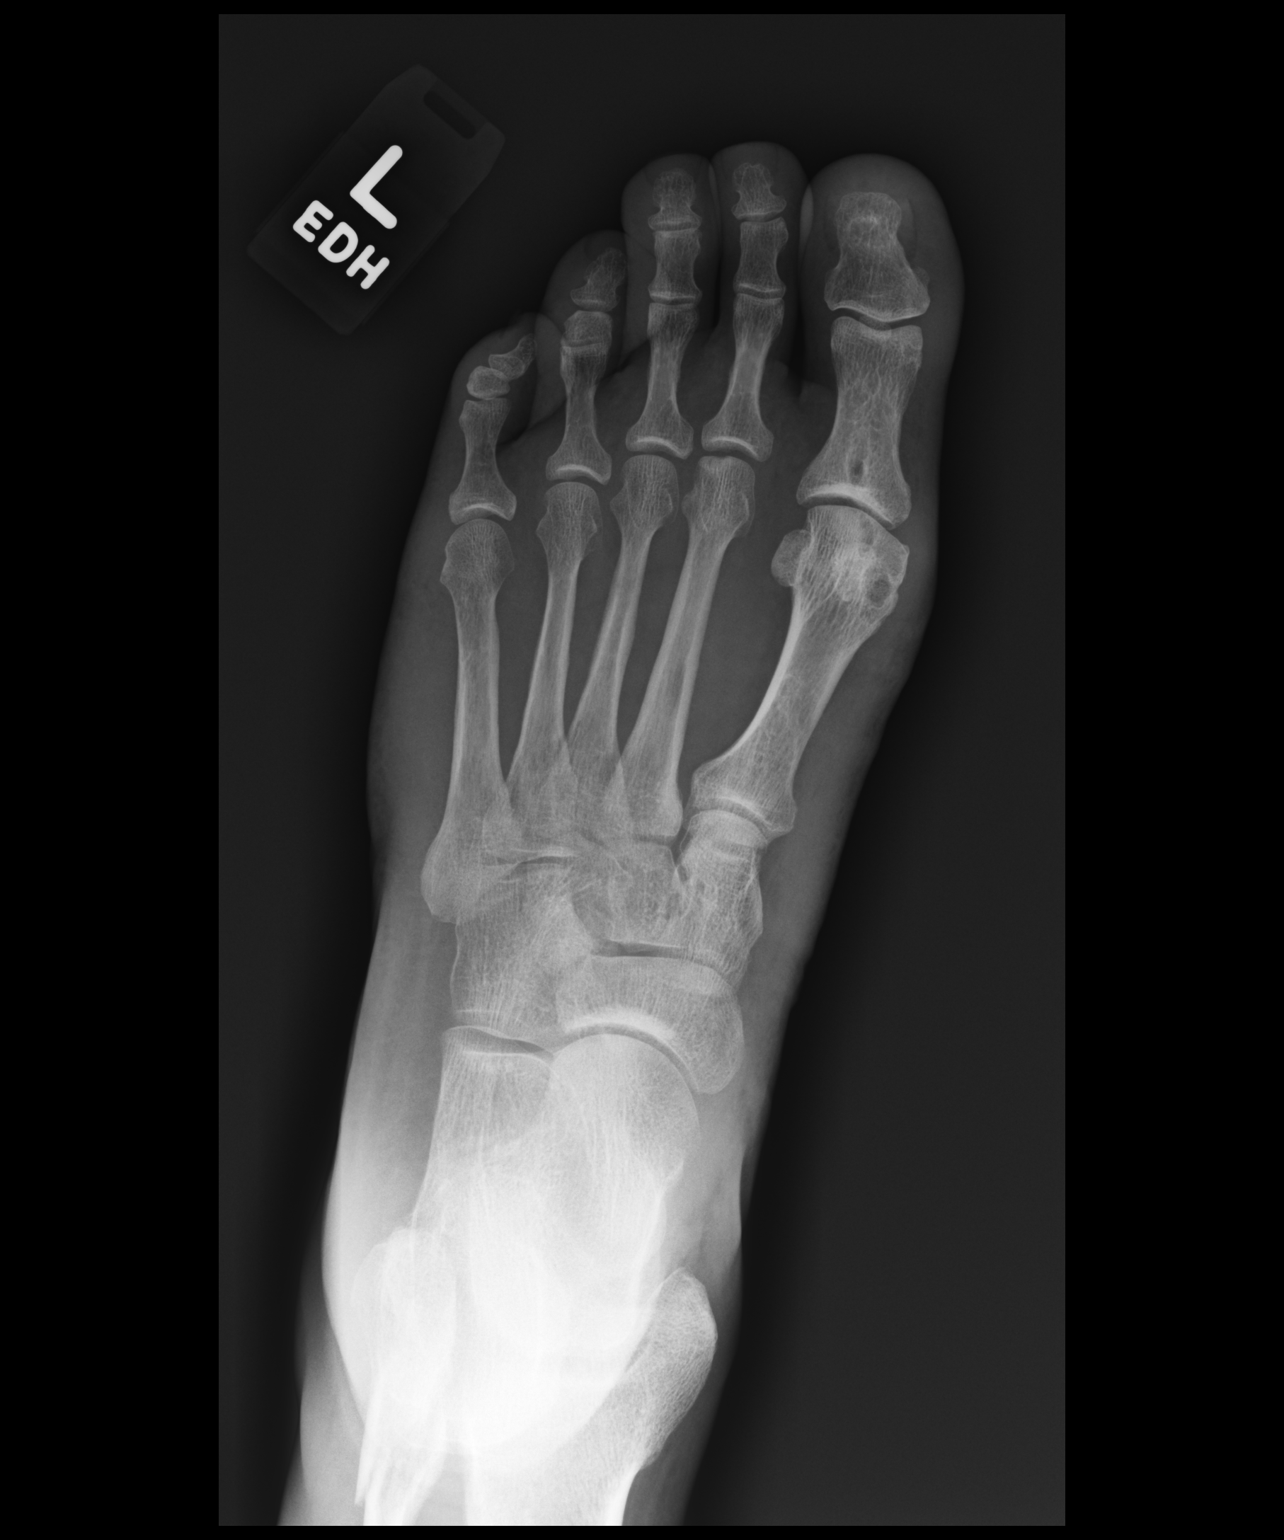

[dg foot complete left (2 of 3)]
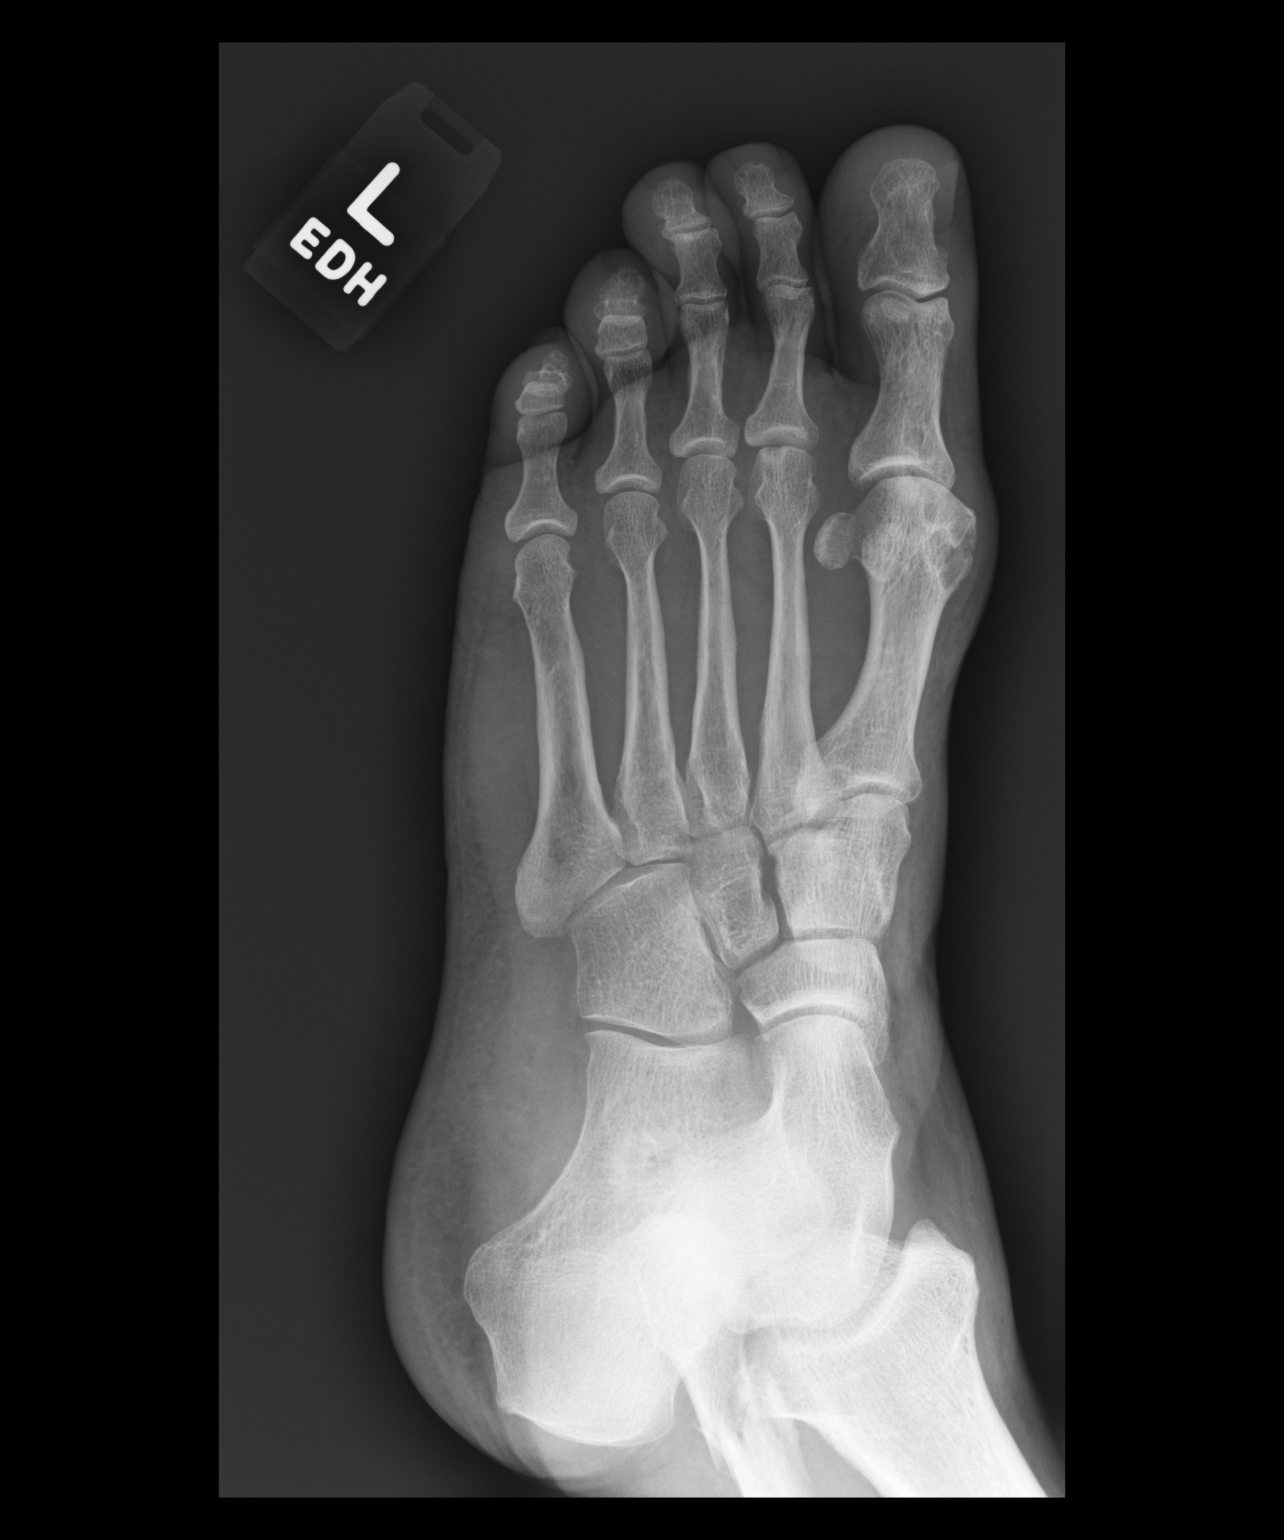

[dg foot complete left (3 of 3)]
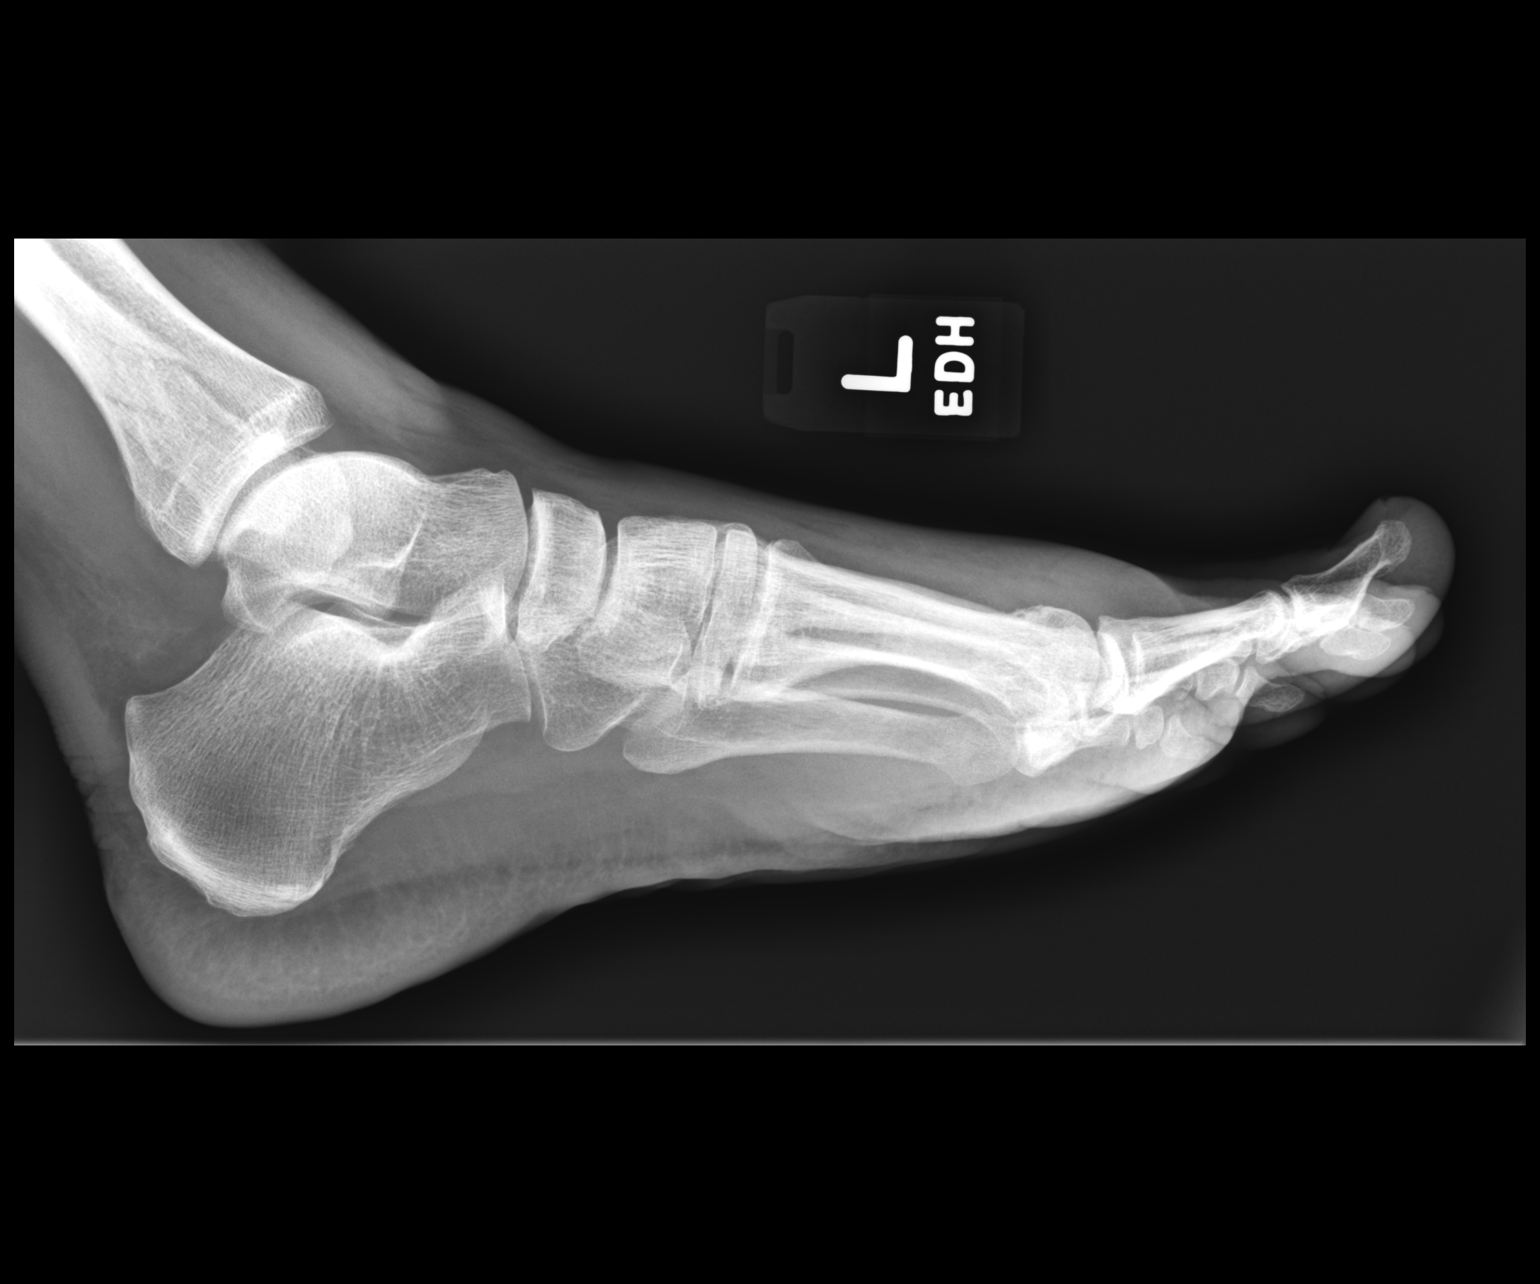

[3 of 3 positions shown; findings below may reference images not displayed]

FINDINGS: Mildly displaced fracture is seen involving the distal left fibula.
Mild degenerative changes seen involving the first
metatarsophalangeal joint. Mild dorsal soft tissue swelling is
noted.
IMPRESSION: Mildly displaced distal left fibular fracture. Mild degenerative
joint disease of the first metatarsophalangeal joint.

## 2022-07-09 DIAGNOSIS — L309 Dermatitis, unspecified: Secondary | ICD-10-CM | POA: Diagnosis not present

## 2022-07-09 DIAGNOSIS — L299 Pruritus, unspecified: Secondary | ICD-10-CM | POA: Diagnosis not present

## 2022-08-09 DIAGNOSIS — R911 Solitary pulmonary nodule: Secondary | ICD-10-CM | POA: Diagnosis not present

## 2022-08-09 DIAGNOSIS — F17210 Nicotine dependence, cigarettes, uncomplicated: Secondary | ICD-10-CM | POA: Diagnosis not present

## 2022-08-09 DIAGNOSIS — E785 Hyperlipidemia, unspecified: Secondary | ICD-10-CM | POA: Diagnosis not present

## 2022-08-09 DIAGNOSIS — I700 Atherosclerosis of aorta: Secondary | ICD-10-CM | POA: Diagnosis not present

## 2022-08-09 DIAGNOSIS — J449 Chronic obstructive pulmonary disease, unspecified: Secondary | ICD-10-CM | POA: Diagnosis not present

## 2022-08-09 DIAGNOSIS — R079 Chest pain, unspecified: Secondary | ICD-10-CM | POA: Diagnosis not present

## 2022-08-21 ENCOUNTER — Ambulatory Visit: Payer: Medicare Other | Admitting: Cardiology

## 2022-08-21 ENCOUNTER — Encounter: Payer: Self-pay | Admitting: Cardiology

## 2022-08-21 VITALS — BP 145/84 | HR 65 | Ht 66.0 in | Wt 165.0 lb

## 2022-08-21 DIAGNOSIS — R911 Solitary pulmonary nodule: Secondary | ICD-10-CM | POA: Diagnosis not present

## 2022-08-21 DIAGNOSIS — R072 Precordial pain: Secondary | ICD-10-CM | POA: Diagnosis not present

## 2022-08-21 DIAGNOSIS — R0989 Other specified symptoms and signs involving the circulatory and respiratory systems: Secondary | ICD-10-CM | POA: Insufficient documentation

## 2022-08-21 NOTE — Progress Notes (Signed)
Patient referred by [facility] for chest pain  Subjective:   Henry Roberts, male    DOB: [birth year], 57 y.o.   MRN: [id number]   Chief Complaint  Patient presents with   Chest Pain   New Patient (Initial Visit)     HPI  57 y.o. Caucasian male , legally blind, current smoker w/COPD, RLL 15.1 mm lung nodule  Patient is legally blind, lives independently, works in [employer] for [employer]. He was referred due to to finding of aortic and aortic valve calcification. See below re: lung workup. He reports baseline exertional dyspnea with known COPD. He also reports precordial pain, sometimes with physical activity.   Patient was recently found to have 15.1 mm RLL nodule.Nodule was deemed not amenable to IR CT guided percutaneous biopsy. He was evaluated by cardiothoracic surgery at [facility]. He was recommended surgical management and SBRT. Patient opted not to undergo surgery because he does not want to be admitted to the hospital, and he is not agreeable to SBRT due to the commitment of having to drive back and forth for several days in a row.   Past Medical History:  Diagnosis Date   Blind      Past Surgical History:  Procedure Laterality Date   EYE SURGERY     WRIST FRACTURE SURGERY Right      Social History   Tobacco Use  Smoking Status Every Day   Packs/day: 1.5   Types: Cigarettes  Smokeless Tobacco Never    Social History   Substance and Sexual Activity  Alcohol Use Yes     No family history on file.    Current Outpatient Medications:    Ascorbic Acid (VITAMIN C) 1000 MG tablet, Take 1,000 mg by mouth daily., Disp: , Rfl:    benzonatate (TESSALON PERLES) 100 MG capsule, Take 1 capsule (100 mg total) by mouth 3 (three) times daily as needed., Disp: 20 capsule, Rfl: 0   Cholecalciferol (HM VITAMIN D3) 100 MCG (4000 UT) CAPS, Take 4,000 Units by mouth daily. , Disp: , Rfl:    Pseudoephedrine-APAP-DM (DAYQUIL MULTI-SYMPTOM PO), Take  1 capsule by mouth daily as needed (congestion)., Disp: , Rfl:    Zinc 30 MG CAPS, Take 30 mg by mouth daily., Disp: , Rfl:    Cardiovascular and other pertinent studies:  Reviewed external labs and tests, independently interpreted  EKG 08/21/2022: Sinus rhythm 56 npm Old anteroseptal infarct  PET scan 04/2022: 1. The nodule within the superior segment of the right lower lobe is FDG avid with SUV max of 2.45. This is suspicious for primary bronchogenic carcinoma. 2. No signs of tracer avid nodal metastasis or distant metastatic disease. 3. Aortic Atherosclerosis (ICD10-I70.0)..   CT chest 03/15/2022: 1. Spiculated 15.1 mm nodule in the superior segment right lower lobe. Lung-RADS 4X, highly suspicious. Additional imaging evaluation or consultation with Pulmonology or Thoracic Surgery recommended. These results will be called to the ordering clinician or representative by the Radiologist Assistant, and communication documented in the PACS or [employer]. 2.  Aortic atherosclerosis (ICD10-I70.0). 3.  Emphysema (ICD10-J43.9).     Recent labs: 08/09/2022: Glucose 96, BUN/Cr 8/0.8. EGFR 101. K 4.5. Hb 14.7 HbA1C 5.8% Chol 191, TG 60, HDL 64, LDL 115   Review of Systems  Cardiovascular:  Positive for chest pain and dyspnea on exertion. Negative for leg swelling, palpitations and syncope.         Vitals:   08/21/22 1254  BP: (!) 145/84  Pulse: 65  SpO2: 99%     Body mass index is 26.63 kg/m. Filed Weights   08/21/22 1254  Weight: 165 lb (74.8 kg)     Objective:   Physical Exam Vitals and nursing note reviewed.  Constitutional:      General: He is not in acute distress. Neck:     Vascular: No JVD.  Cardiovascular:     Rate and Rhythm: Normal rate and regular rhythm.     Heart sounds: Normal heart sounds. No murmur heard. Pulmonary:     Effort: Pulmonary effort is normal.     Breath sounds: Normal breath sounds. No wheezing or rales.   Musculoskeletal:     Right lower leg: No edema.     Left lower leg: No edema.           Visit diagnoses:   ICD-10-CM   1. Precordial pain  R07.2 EKG 12-Lead    PCV ECHOCARDIOGRAM COMPLETE    PCV MYOCARDIAL PERFUSION WO LEXISCAN    Ambulatory referral to Cardiothoracic Surgery    2. Bruit of left carotid artery  R09.89 PCV CAROTID DUPLEX (BILATERAL)    3. Lung nodule  R91.1        Orders Placed This Encounter  Procedures   Ambulatory referral to Cardiothoracic Surgery   PCV MYOCARDIAL PERFUSION WO LEXISCAN   EKG 12-Lead   PCV ECHOCARDIOGRAM COMPLETE   PCV CAROTID DUPLEX (BILATERAL)     Medication changes this visit: There are no discontinued medications.  No orders of the defined types were placed in this encounter.    Assessment & Recommendations:    57 y.o. Caucasian male , legally blind, current smoker w/COPD, RLL 15.1 mm lung nodule  Precordial pain, exertional dyspnea: Smoker, aortic atherosclerosis on CT chest. Recommend Lexiscan nuclear stress test, echocardiogram. Blood pressure elevated today, recheck at next visit.  Lung nodule: Highly suspicious for bronchogenic carcinoma. He was seen by cardiothoracic surgery but refused treatment, because he "wants to test first and then do surgery". I have encouraged him to get another opinion.  Nicotine dependence: Tobacco cessation counseling:  - Currently smoking 1.5-2 packs/day   - Patient was informed of the dangers of tobacco abuse including stroke, cancer, and MI, as well as benefits of tobacco cessation. - Patient is NOT willing to quit at this time. - Approximately 5 mins were spent counseling patient cessation techniques. We discussed various methods to help quit smoking, including deciding on a date to quit, joining a support group, pharmacological agents.  - I will reassess his progress at the next follow-up visit  Further recommendations after above testing   Thank you for referring the patient  to [place]. Please feel free to contact with any questions.   Elder Negus, MD Pager: [phone] Office: [phone]

## 2022-08-22 DIAGNOSIS — R911 Solitary pulmonary nodule: Secondary | ICD-10-CM | POA: Diagnosis not present

## 2022-08-26 ENCOUNTER — Ambulatory Visit: Payer: Medicare Other

## 2022-08-26 DIAGNOSIS — R072 Precordial pain: Secondary | ICD-10-CM | POA: Diagnosis not present

## 2022-09-03 NOTE — Progress Notes (Signed)
Lvm for pt to call back. 

## 2022-09-03 NOTE — Progress Notes (Signed)
Pt aware.

## 2022-09-13 ENCOUNTER — Ambulatory Visit: Payer: Medicare Other

## 2022-09-13 DIAGNOSIS — R072 Precordial pain: Secondary | ICD-10-CM | POA: Diagnosis not present

## 2022-09-13 DIAGNOSIS — R0989 Other specified symptoms and signs involving the circulatory and respiratory systems: Secondary | ICD-10-CM | POA: Diagnosis not present

## 2022-09-24 ENCOUNTER — Institutional Professional Consult (permissible substitution) (INDEPENDENT_AMBULATORY_CARE_PROVIDER_SITE_OTHER): Payer: Medicare Other | Admitting: Thoracic Surgery (Cardiothoracic Vascular Surgery)

## 2022-09-24 ENCOUNTER — Other Ambulatory Visit: Payer: Self-pay | Admitting: Thoracic Surgery (Cardiothoracic Vascular Surgery)

## 2022-09-24 ENCOUNTER — Encounter: Payer: Self-pay | Admitting: Thoracic Surgery (Cardiothoracic Vascular Surgery)

## 2022-09-24 VITALS — BP 123/71 | HR 64 | Resp 20 | Ht 66.0 in | Wt 156.0 lb

## 2022-09-24 DIAGNOSIS — R911 Solitary pulmonary nodule: Secondary | ICD-10-CM | POA: Diagnosis not present

## 2022-09-24 NOTE — Progress Notes (Signed)
PCP is Jackelyn Poling, DO Referring Provider is Patwardhan, Anabel Bene, MD  Chief Complaint  Patient presents with   Lung Lesion    CT chest 1/5, PET 2/2, PFTs 3/2    HPI: Henry Roberts sent for consultation regarding lung nodule.  Henry Roberts is a legally blind 57 year old man with a history of tobacco abuse and COPD.  He was found to have a lung nodule on a low-dose CT for lung cancer screening back in January.  He saw Dr. Mickle Mallory at [facility] who recommended surgical resection.  Also offered him the option of stereotactic radiation.  Henry Roberts did not want to be hospitalized even for a brief stay and did not want to go for multiple radiation visits.  He refused any intervention but did agree to follow-up.  He saw Dr. Rosemary Holms recently for chest pain.  He did an echo and a stress test which showed normal left-ventricular function and no evidence of ischemia.  His chest pain is since resolved.  Dr. Rosemary Holms referred him for a second opinion.  He works with [employer].  Does not walk much on a regular basis.  Denies any chest pain, pressure, tightness, or shortness of breath with normal activities.  No significant change in appetite or weight loss.  Still smoking about a pack and a half a day.  Has never seriously tried to quit and has no interest in doing so.  Zubrod Score: At the time of surgery this patient's most appropriate activity status/level should be described as: [x]     0    Normal activity, no symptoms []     1    Restricted in physical strenuous activity but ambulatory, able to do out light work []     2    Ambulatory and capable of self care, unable to do work activities, up and about >50 % of waking hours                              []     3    Only limited self care, in bed greater than 50% of waking hours []     4    Completely disabled, no self care, confined to bed or chair []     5    Moribund   Past Medical History:  Diagnosis Date   Blind     Past  Surgical History:  Procedure Laterality Date   EYE SURGERY     WRIST FRACTURE SURGERY Right     Family History  Problem Relation Age of Onset   Hypertension Mother     Social History Social History   Tobacco Use   Smoking status: Every Day    Current packs/day: 1.50    Types: Cigarettes   Smokeless tobacco: Never  Vaping Use   Vaping status: Never Used  Substance Use Topics   Alcohol use: Yes   Drug use: Never    Current Outpatient Medications  Medication Sig Dispense Refill   Ascorbic Acid (VITAMIN C) 1000 MG tablet Take 1,000 mg by mouth daily.     Cholecalciferol (HM VITAMIN D3) 100 MCG (4000 UT) CAPS Take 4,000 Units by mouth daily.      No current facility-administered medications for this visit.    No Known Allergies  Review of Systems  Constitutional:  Negative for chills, fever and unexpected weight change.  HENT:  Negative for trouble swallowing and voice change.   Eyes:  Blind.  Can see some shapes depending on lighting and contrast.  Respiratory:  Negative for shortness of breath.   Cardiovascular:  Positive for chest pain (Recent, resolved). Negative for palpitations and leg swelling.  Neurological:  Negative for seizures and weakness.  All other systems reviewed and are negative.   BP 123/71 (BP Location: Left Arm, Patient Position: Sitting, Cuff Size: Normal)   Pulse 64   Resp 20   Ht 5\' 6"  (1.676 m)   Wt 156 lb (70.8 kg)   SpO2 95% Comment: RA  BMI 25.18 kg/m  Physical Exam Vitals reviewed.  Constitutional:      General: He is not in acute distress.    Appearance: Normal appearance.  HENT:     Head: Normocephalic and atraumatic.  Neck:     Vascular: Carotid bruit (Left) present.  Cardiovascular:     Rate and Rhythm: Normal rate and regular rhythm.     Heart sounds: Normal heart sounds. No murmur heard.    No friction rub. No gallop.  Pulmonary:     Effort: Pulmonary effort is normal. No respiratory distress.     Breath sounds:  Normal breath sounds. No wheezing or rales.  Abdominal:     General: There is no distension.     Palpations: Abdomen is soft.  Musculoskeletal:     Cervical back: Neck supple.  Lymphadenopathy:     Cervical: No cervical adenopathy.  Skin:    General: Skin is warm and dry.  Neurological:     General: No focal deficit present.     Mental Status: He is alert and oriented to person, place, and time.     Cranial Nerves: No cranial nerve deficit.     Motor: No weakness.    Diagnostic Tests: CT CHEST WITHOUT CONTRAST LOW-DOSE FOR LUNG CANCER SCREENING   TECHNIQUE: Multidetector CT imaging of the chest was performed following the standard protocol without IV contrast.   RADIATION DOSE REDUCTION: This exam was performed according to the departmental dose-optimization program which includes automated exposure control, adjustment of the mA and/or kV according to patient size and/or use of iterative reconstruction technique.   COMPARISON:  06/05/2018.   FINDINGS: Cardiovascular: Atherosclerotic calcification of the aorta and aortic valve. Heart size normal. No pericardial effusion.   Mediastinum/Nodes: No pathologically enlarged mediastinal or axillary lymph nodes. Hilar regions are difficult to definitively evaluate without IV contrast. Esophagus is grossly unremarkable.   Lungs/Pleura: Centrilobular emphysema. Mild smoking related respiratory bronchiolitis. 15.1 mm spiculated nodule in the superior segment right lower lobe (3/197). 4.7 mm right middle lobe nodule. No pleural fluid. Airway is unremarkable.   Upper Abdomen: Visualized portions of the liver, gallbladder, adrenal glands, kidneys, spleen, pancreas, stomach and bowel are grossly unremarkable. No upper abdominal adenopathy.   Musculoskeletal: None.   IMPRESSION: 1. Spiculated 15.1 mm nodule in the superior segment right lower lobe. Lung-RADS 4X, highly suspicious. Additional imaging evaluation or consultation with  Pulmonology or Thoracic Surgery recommended. These results will be called to the ordering clinician or representative by the Radiologist Assistant, and communication documented in the PACS or [employer]. 2.  Aortic atherosclerosis (ICD10-I70.0). 3.  Emphysema (ICD10-J43.9).     Electronically Signed   By: Leanna Battles M.D.   On: 03/18/2022 08:53 I personally reviewed the CT and PET/CT images from January and February of this year respectively.  CT showed a 15 mm spiculated nodule in the superior segment of the right lower lobe.  No mediastinal or hilar adenopathy.  There was aortic atherosclerosis and emphysema.  PET/CT showed mild uptake with an SUV of 2.5.  No evidence of metastatic disease.  Pulmonary function testing 05/03/2022 FVC 3.23 (88%) FEV1 2.00 (68%) FEV1 2.10 (72%) postbronchodilator DLCO 17.18 (71%)  Impression: Henry Roberts is a legally blind 57 year old man with a history of tobacco abuse and COPD.  He was found to have a lung nodule on a low-dose CT for lung cancer screening back in January.  On PET/CT the nodule is hypermetabolic.  PFT showed adequate pulmonary reserve to tolerate surgery.  Henry Roberts is legally blind and unable to see the detailed necessary to review the films with him.  Therefore I described the findings on the CT and PET/CT to him.  He understands there is a nodule in the right lung that is highly suspicious for a new primary bronchogenic carcinoma.  Infectious and inflammatory nodules are also in the differential, but given his age, smoking history, and appearance of the nodule this has to be considered a lung cancer unless it can be proven otherwise.  I prefaced our discussion with the fact that we are basing her recommendations on scans that are 6 months old at this point.  He needs a new scan before we can make any definitive recommendation currently.  I did inform him that I would have recommended exactly the same thing that Dr. Mickle Mallory  recommended earlier in the year, which would be surgical resection for definitive diagnosis and treatment at the same setting.  Barring that the next best option would be stereotactic radiation.  I briefly described the proposed operative procedure to him.  The plan would be to do a robotic assisted wedge resection and then possible anatomic resection based on intraoperative findings.  We touched on the risks and benefits and discussed the expected hospital stay and recovery time.  He says that he does not want to have surgery or radiation or chemotherapy.  I recommended we at least get another scan to check on the progress of the nodule and then we can further discuss treatment options at that point.  He is currently asymptomatic from the nodule, but I emphasized that that will not remain the case if it goes untreated.  He reluctantly agreed to get a new scan but I am concerned that he may opt to not actually go through with it.  Tobacco abuse-no interest in tobacco cessation.  I emphasized the importance of that anyway.  Plan: CT chest and return in 2 weeks to discuss findings  Spent over 30 minutes in review of records, images, and in consultation with Henry Roberts today. Loreli Slot, MD [facility] [phone]

## 2022-10-04 ENCOUNTER — Encounter: Payer: Self-pay | Admitting: Cardiology

## 2022-10-04 ENCOUNTER — Ambulatory Visit: Payer: Medicare Other | Admitting: Cardiology

## 2022-10-04 VITALS — BP 111/64 | HR 56 | Resp 16 | Ht 66.0 in | Wt 156.0 lb

## 2022-10-04 DIAGNOSIS — R072 Precordial pain: Secondary | ICD-10-CM | POA: Diagnosis not present

## 2022-10-04 DIAGNOSIS — E782 Mixed hyperlipidemia: Secondary | ICD-10-CM | POA: Diagnosis not present

## 2022-10-04 DIAGNOSIS — R911 Solitary pulmonary nodule: Secondary | ICD-10-CM

## 2022-10-04 NOTE — Progress Notes (Signed)
Patient referred by Jackelyn Poling, DO for chest pain  Subjective:   Henry Roberts, male    DOB: 1965/03/23, 57 y.o.   MRN: [id number]   Chief Complaint  Patient presents with   Chest Pain   Follow-up    6 week     HPI  57 y.o. Caucasian male , legally blind, current smoker w/COPD, RLL 15.1 mm lung nodule  Reviewed recent test results with the patient, details below. Since his last visit me, he met with Dr. Dorris Fetch and is going to get repeat CT scan.   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 He was referred due to to finding of aortic and aortic valve calcification. See below re: lung workup. He reports baseline exertional dyspnea with known COPD. He also reports precordial pain, sometimes with physical activity.   Patient was recently found to have 15.1 mm RLL nodule.Nodule was deemed not amenable to IR CT guided percutaneous biopsy. He was evaluated by cardiothoracic surgery at [facility]. He was recommended surgical management and SBRT. Patient opted not to undergo surgery because he does not want to be admitted to the hospital, and he is not agreeable to SBRT due to the commitment of having to drive back and forth for several days in a row.   Current Outpatient Medications:    albuterol (VENTOLIN HFA) 108 (90 Base) MCG/ACT inhaler, Inhale 2 puffs into the lungs every 4 (four) hours as needed., Disp: , Rfl:    Ascorbic Acid (VITAMIN C) 1000 MG tablet, Take 1,000 mg by mouth daily., Disp: , Rfl:    Cholecalciferol (HM VITAMIN D3) 100 MCG (4000 UT) CAPS, Take 4,000 Units by mouth daily. , Disp: , Rfl:    Cardiovascular and other pertinent studies:  Reviewed external labs and tests, independently interpreted  EKG 08/21/2022: Sinus rhythm 56 npm Old anteroseptal infarct  Echocardiogram 09/13/2022: Normal LV systolic function with EF 61%. Left ventricle cavity is normal in size. Normal  left ventricular wall thickness. Normal global wall motion. Normal diastolic filling pattern. Calculated EF 61%. Trileaflet aortic valve. Mild (Grade I) aortic regurgitation. Mild aortic valve leaflet calcification. Structurally normal tricuspid valve with trace regurgitation. No evidence of pulmonary hypertension.  Carotid artery duplex 09/13/2022: Duplex suggests stenosis in the right internal carotid artery (minimal). Duplex suggests stenosis in the left internal carotid artery (minimal). Very mild homogeneous plaque noted in the bilateral carotid arteries. Antegrade right vertebral artery flow. Antegrade left vertebral artery flow.   Regadenoson Nuclear stress test 08/26/2022: Myocardial perfusion is normal. Overall LV systolic function is normal without regional wall motion abnormalities. Stress LV EF: 57%.  Nondiagnostic ECG stress. The heart rate response was consistent with Regadenoson.   No previous exam available for comparison. Low risk.     PET scan 04/2022: 1. The nodule within the superior segment of the right lower lobe is FDG avid with SUV max of 2.45. This is suspicious for primary bronchogenic carcinoma. 2. No signs of tracer avid nodal metastasis or distant metastatic disease. 3. Aortic Atherosclerosis (ICD10-I70.0)..   CT chest 03/15/2022: 1. Spiculated 15.1 mm nodule in the superior segment right lower lobe. Lung-RADS 4X, highly suspicious. Additional imaging evaluation or consultation with Pulmonology or Thoracic Surgery recommended. These results will be called to the ordering clinician or representative by the Radiologist Assistant, and communication documented in the PACS or [employer]. 2.  Aortic atherosclerosis (ICD10-I70.0). 3.  Emphysema (ICD10-J43.9).  Recent labs: 08/09/2022: Glucose 96, BUN/Cr 8/0.8. EGFR 101. K 4.5. Hb 14.7 HbA1C 5.8% Chol 191, TG 60, HDL 64, LDL 115   Review of Systems  Cardiovascular:  Positive for chest pain and  dyspnea on exertion. Negative for leg swelling, palpitations and syncope.         Vitals:   10/04/22 1236  BP: 111/64  Pulse: (!) 56  Resp: 16  SpO2: 95%     Body mass index is 25.18 kg/m. Filed Weights   10/04/22 1236  Weight: 156 lb (70.8 kg)     Objective:   Physical Exam Vitals and nursing note reviewed.  Constitutional:      General: He is not in acute distress. Neck:     Vascular: No JVD.  Cardiovascular:     Rate and Rhythm: Normal rate and regular rhythm.     Heart sounds: Normal heart sounds. No murmur heard. Pulmonary:     Effort: Pulmonary effort is normal.     Breath sounds: Normal breath sounds. No wheezing or rales.  Musculoskeletal:     Right lower leg: No edema.     Left lower leg: No edema.           Visit diagnoses:   ICD-10-CM   1. Precordial pain  R07.2     2. Lung nodule  R91.1     3. Mixed hyperlipidemia  E78.2          Assessment & Recommendations:    57 y.o. Caucasian male , legally blind, current smoker w/COPD, RLL 15.1 mm lung nodule  Precordial pain, exertional dyspnea: Smoker, aortic atherosclerosis on CT chest. Structurally normal heart (echocardiogram 09/2022). No ischemia on stress testing (08/2022). He is not interested in smoking cessation or lipid lowering therapy.  Continue f/u w/Dr. Dorris Fetch re: lung nodule. He should be moderate cardiac risk for any lung nodule biopsy or surgery.   I will see him on as needed basis.     Elder Negus, MD Pager: [phone] Office: [phone]

## 2022-10-08 ENCOUNTER — Ambulatory Visit (HOSPITAL_COMMUNITY)
Admission: RE | Admit: 2022-10-08 | Discharge: 2022-10-08 | Disposition: A | Discharge status: Home / Self Care | Payer: Medicare Other | Source: Ambulatory Visit | Attending: Thoracic Surgery (Cardiothoracic Vascular Surgery) | Admitting: Thoracic Surgery (Cardiothoracic Vascular Surgery)

## 2022-10-08 DIAGNOSIS — R911 Solitary pulmonary nodule: Secondary | ICD-10-CM | POA: Insufficient documentation

## 2022-10-08 DIAGNOSIS — I700 Atherosclerosis of aorta: Secondary | ICD-10-CM | POA: Diagnosis not present

## 2022-10-15 ENCOUNTER — Encounter: Payer: Self-pay | Admitting: Thoracic Surgery (Cardiothoracic Vascular Surgery)

## 2022-10-15 ENCOUNTER — Ambulatory Visit (INDEPENDENT_AMBULATORY_CARE_PROVIDER_SITE_OTHER): Payer: Medicare Other | Admitting: Thoracic Surgery (Cardiothoracic Vascular Surgery)

## 2022-10-15 VITALS — BP 117/74 | HR 61 | Resp 20 | Ht 66.0 in | Wt 156.0 lb

## 2022-10-15 DIAGNOSIS — R911 Solitary pulmonary nodule: Secondary | ICD-10-CM

## 2022-10-15 NOTE — Progress Notes (Signed)
[address]       Henry Roberts [address]             [phone]    HPI: Mr. Henry Roberts returns for follow-up of his right lung nodule  Henry Roberts is a 57 year old legally blind man with a history of tobacco abuse, COPD, and a right lower lobe lung nodule.  Has smoked about a pack and half cigarettes a day for 40 years.  Found to have a lung nodule on a low-dose CT for lung cancer screening in January 2024.  Saw Dr. Mickle Mallory at [facility] who recommended resection.  He refused that and also refused stereotactic radiation.  He saw Dr. Rosemary Holms for chest pain.  Workup was negative.  He was referred to me for second opinion.  I also recommended surgical resection or stereotactic radiation and he again refused treatment but did desire to have a follow-up scan done.  Continues to smoke about a pack and a half a day.  He works for the [employer] for [employer].  Is ambulatory with a cane.  Not interested in quitting smoking.  Still refuses surgery and radiation.  Past Medical History:  Diagnosis Date   Blind     Current Outpatient Medications  Medication Sig Dispense Refill   albuterol (VENTOLIN HFA) 108 (90 Base) MCG/ACT inhaler Inhale 2 puffs into the lungs every 4 (four) hours as needed.     Ascorbic Acid (VITAMIN C) 1000 MG tablet Take 1,000 mg by mouth daily.     Cholecalciferol (HM VITAMIN D3) 100 MCG (4000 UT) CAPS Take 4,000 Units by mouth daily.      No current facility-administered medications for this visit.    Physical Exam BP 117/74   Pulse 61   Resp 20   Ht 5\' 6"  (1.676 m)   Wt 156 lb (70.8 kg)   SpO2 96% Comment: RA  BMI 25.18 kg/m  Well-appearing 57 year old man in no acute distress Neuro alert and oriented x 3, blind, no motor deficit No cervical supraclavicular adenopathy Lungs clear bilaterally Cardiac regular rate and rhythm with no murmur  Diagnostic Tests: CT CHEST WITHOUT CONTRAST   TECHNIQUE: Multidetector CT imaging of the chest was  performed following the standard protocol without IV contrast.   RADIATION DOSE REDUCTION: This exam was performed according to the departmental dose-optimization program which includes automated exposure control, adjustment of the mA and/or kV according to patient size and/or use of iterative reconstruction technique.   COMPARISON:  08/14/2022   FINDINGS: Cardiovascular: The heart size is normal. No substantial pericardial effusion. Mild atherosclerotic calcification is noted in the wall of the thoracic aorta.   Mediastinum/Nodes: No mediastinal lymphadenopathy. No evidence for gross hilar lymphadenopathy although assessment is limited by the lack of intravenous contrast on the current study. The esophagus has normal imaging features. There is no axillary lymphadenopathy.   Lungs/Pleura: Paraspinal right lower lobe irregular pulmonary nodule is not substantially changed in the interval measuring 1.9 x 1.2 cm today compared to 2.0 x 1.1 cm (remeasured) previously. No new suspicious pulmonary nodule or mass. No focal airspace consolidation. There is no evidence of pleural effusion.   Upper Abdomen: Visualized portion of the upper abdomen is unremarkable.   Musculoskeletal: No worrisome lytic or sclerotic osseous abnormality.   IMPRESSION: 1. No substantial change in the paraspinal right lower lobe irregular pulmonary nodule. Imaging features remain concerning for neoplasm. No new suspicious pulmonary nodule or mass. 2.  Aortic Atherosclerosis (ICD10-I70.0).  Electronically Signed   By: Kennith Center M.D.   On: 10/14/2022 13:59 I personally reviewed the CT images.  Somewhat difficult to visualize on axial images due to markings from the radiologist.  No significant change in the irregular nodule.  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  No mediastinal or hilar adenopathy.  Aortic atherosclerosis.  Impression: Henry Roberts is a 57 year old legally blind man with a  history of tobacco abuse, COPD, and a right lower lobe lung nodule.    Right lower lobe lung nodule-first noted back in January.  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  Infectious and inflammatory nodules are in the differential, but are less likely.  He previously refused biopsy, surgery, and radiation.  He still refuses all of those.  However, he says he does want to "keep track of it."  I again had a discussion with him about how delaying treatment of this may be potentially allow it to grow or spread and become unresectable.  He still refuses to consider surgery.  Plan: Will have him return in 6 months with a CT of the chest.  He can see one of the PAs at that time and then if he is interested in surgery I can talk to him as well.  Loreli Slot, MD [facility] [phone]

## 2022-12-05 ENCOUNTER — Encounter (HOSPITAL_COMMUNITY): Payer: Self-pay | Admitting: Emergency Medicine

## 2022-12-05 ENCOUNTER — Other Ambulatory Visit: Payer: Self-pay

## 2022-12-05 ENCOUNTER — Emergency Department (HOSPITAL_COMMUNITY)
Admission: EM | Admit: 2022-12-05 | Discharge: 2022-12-05 | Disposition: A | Discharge status: Home / Self Care | Payer: Medicare Other | Attending: Emergency Medicine | Admitting: Emergency Medicine

## 2022-12-05 DIAGNOSIS — R42 Dizziness and giddiness: Secondary | ICD-10-CM | POA: Diagnosis not present

## 2022-12-05 DIAGNOSIS — R1110 Vomiting, unspecified: Secondary | ICD-10-CM | POA: Insufficient documentation

## 2022-12-05 LAB — BASIC METABOLIC PANEL
Anion gap: 12 (ref 5–15)
BUN: 12 mg/dL (ref 6–20)
CO2: 23 mmol/L (ref 22–32)
Calcium: 9.2 mg/dL (ref 8.9–10.3)
Chloride: 98 mmol/L (ref 98–111)
Creatinine, Ser: 0.58 mg/dL — ABNORMAL LOW (ref 0.61–1.24)
GFR, Estimated: >60 mL/min (ref >60)
Glucose, Bld: 134 mg/dL — ABNORMAL HIGH (ref 70–99)
Potassium: 3.5 mmol/L (ref 3.5–5.1)
Sodium: 133 mmol/L — ABNORMAL LOW (ref 135–145)

## 2022-12-05 LAB — CBC WITH DIFFERENTIAL/PLATELET
Abs Immature Granulocytes: 0.01 10*3/uL (ref 0.00–0.07)
Basophils Absolute: 0 10*3/uL (ref 0.0–0.1)
Basophils Relative: 1 %
Eosinophils Absolute: 0.1 10*3/uL (ref 0.0–0.5)
Eosinophils Relative: 2 %
HCT: 38.2 % — ABNORMAL LOW (ref 39.0–52.0)
Hemoglobin: 13.6 g/dL (ref 13.0–17.0)
Immature Granulocytes: 0 %
Lymphocytes Relative: 26 %
Lymphs Abs: 1.6 10*3/uL (ref 0.7–4.0)
MCH: 33.8 pg (ref 26.0–34.0)
MCHC: 35.6 g/dL (ref 30.0–36.0)
MCV: 95 fL (ref 80.0–100.0)
Monocytes Absolute: 0.8 10*3/uL (ref 0.1–1.0)
Monocytes Relative: 13 %
Neutro Abs: 3.7 10*3/uL (ref 1.7–7.7)
Neutrophils Relative %: 58 %
Platelets: 188 10*3/uL (ref 150–400)
RBC: 4.02 MIL/uL — ABNORMAL LOW (ref 4.22–5.81)
RDW: 13.8 % (ref 11.5–15.5)
WBC: 6.2 10*3/uL (ref 4.0–10.5)
nRBC: 0 % (ref 0.0–0.2)

## 2022-12-05 MED ORDER — MECLIZINE HCL 25 MG PO TABS: 25.0000 mg | ORAL_TABLET | Freq: Three times a day (TID) | ORAL | 0 refills | Status: AC | PRN

## 2022-12-05 MED ORDER — MECLIZINE HCL 25 MG PO TABS
25.0000 mg | ORAL_TABLET | Freq: Once | ORAL | Status: AC
  Administered 2022-12-05: 25 mg via ORAL
  Filled 2022-12-05: qty 1

## 2022-12-05 NOTE — ED Provider Notes (Signed)
[place] EMERGENCY DEPARTMENT AT [facility] Provider Note   CSN: [id number] Arrival date & time: 12/05/22  1319     History  Chief Complaint  Patient presents with   Dizziness    Henry Roberts is a 57 y.o. male.   Dizziness Patient presents with dizziness/vertigo.  Began after getting out of the shower earlier this morning.  States even has vomited once or twice with it.  Does have previous history of vertigo.  States his right ear always rings.  Patient is blind.  No headache.  No acute confusion.  No numbness weakness.    Past Medical History:  Diagnosis Date   Blind     Home Medications Prior to Admission medications   Medication Sig Start Date End Date Taking? Authorizing Provider  meclizine (ANTIVERT) 25 MG tablet Take 1 tablet (25 mg total) by mouth 3 (three) times daily as needed for dizziness. 12/05/22  Yes Benjiman Core, MD  albuterol (VENTOLIN HFA) 108 (90 Base) MCG/ACT inhaler Inhale 2 puffs into the lungs every 4 (four) hours as needed. 05/23/22   [provider]  Ascorbic Acid (VITAMIN C) 1000 MG tablet Take 1,000 mg by mouth daily.    [provider]  Cholecalciferol (HM VITAMIN D3) 100 MCG (4000 UT) CAPS Take 4,000 Units by mouth daily.     [provider]      Allergies    Claritin [loratadine]    Review of Systems   Review of Systems  Neurological:  Positive for dizziness.    Physical Exam Updated Vital Signs BP 132/77   Pulse (!) 58   Temp (!) 97.3 F (36.3 C) (Oral)   Resp 16   Ht 5\' 6"  (1.676 m)   Wt 70.8 kg   SpO2 98%   BMI 25.19 kg/m  Physical Exam Vitals and nursing note reviewed.  HENT:     Right Ear: Tympanic membrane normal.     Left Ear: Tympanic membrane normal.  Eyes:     Comments: Patient is blind.  Cardiovascular:     Rate and Rhythm: Regular rhythm.  Pulmonary:     Breath sounds: No wheezing.  Musculoskeletal:        General: No tenderness.     Cervical back: Neck supple.   Skin:    General: Skin is warm.  Neurological:     Mental Status: He is alert and oriented to person, place, and time.     ED Results / Procedures / Treatments   Labs (all labs ordered are listed, but only abnormal results are displayed) Labs Reviewed  CBC WITH DIFFERENTIAL/PLATELET - Abnormal; Notable for the following components:      Result Value   RBC 4.02 (*)    HCT 38.2 (*)    All other components within normal limits  BASIC METABOLIC PANEL - Abnormal; Notable for the following components:   Sodium 133 (*)    Glucose, Bld 134 (*)    Creatinine, Ser 0.58 (*)    All other components within normal limits    EKG None  Radiology No results found.  Procedures Procedures    Medications Ordered in ED Medications  meclizine (ANTIVERT) tablet 25 mg (25 mg Oral Given 12/05/22 1351)    ED Course/ Medical Decision Making/ A&P                                 Medical Decision Making  Patient with dizziness.  History of vertigo.  This appears to be more vertiginous.  Feels that things are moving around.  Feeling better now after oral treatment with Antivert.  Blood work reassuring.  Doubt central cause of vertigo.  Appears stable for discharge home with ENT follow-up and symptomatic treatment.  Will discharge.        Final Clinical Impression(s) / ED Diagnoses Final diagnoses:  Vertigo    Rx / DC Orders ED Discharge Orders          Ordered    meclizine (ANTIVERT) 25 MG tablet  3 times daily PRN        12/05/22 1845              Benjiman Core, MD 12/05/22 2326

## 2022-12-05 NOTE — ED Triage Notes (Signed)
Pt BIBA from work, reports he woke up this morning feeling sore in his neck, began to feel dizzy at lunch time, episode of nausea after eating lunch, x2 episode of emesis. Has a hx of vertigo. Denies LOC or any other symptoms.

## 2022-12-05 NOTE — ED Provider Triage Note (Signed)
Emergency Medicine Provider Triage Evaluation Note  Henry Roberts , a 57 y.o. male  was evaluated in triage.  Pt complains of dizziness with associated nausea and vomiting.  Symptoms started when he got out of the shower around 4 AM this morning.  He states that it went away briefly right before lunchtime and then came back worse.  Patient is legally blind.  Review of Systems  Positive:  Negative: See above   Physical Exam  BP 132/77 (BP Location: Right Arm)   Pulse 60   Temp 97.8 F (36.6 C) (Oral)   Resp 16   Ht 5\' 6"  (1.676 m)   Wt 70.8 kg   SpO2 98%   BMI 25.19 kg/m  Gen:   Awake, no distress   Resp:  Normal effort  MSK:   Moves extremities without difficulty  Other:  Cranial nerves II through XII are intact apart from cranial nerve III as her is obvious horizontal sustained nystagmus worse with looking to the left.  He has normal strength and sensation in all 4 extremities.  Speech is normal.  Medical Decision Making  Medically screening exam initiated at 1:47 PM.  Appropriate orders placed.  Cristino Bedore was informed that the remainder of the evaluation will be completed by another provider, this initial triage assessment does not replace that evaluation, and the importance of remaining in the ED until their evaluation is complete.     Honor Loh Garner, [place] 12/05/22 1348

## 2022-12-17 DIAGNOSIS — H9193 Unspecified hearing loss, bilateral: Secondary | ICD-10-CM | POA: Diagnosis not present

## 2022-12-17 DIAGNOSIS — Z6825 Body mass index (BMI) 25.0-25.9, adult: Secondary | ICD-10-CM | POA: Diagnosis not present

## 2022-12-17 DIAGNOSIS — R42 Dizziness and giddiness: Secondary | ICD-10-CM | POA: Diagnosis not present

## 2022-12-17 DIAGNOSIS — H6591 Unspecified nonsuppurative otitis media, right ear: Secondary | ICD-10-CM | POA: Diagnosis not present

## 2023-01-21 ENCOUNTER — Ambulatory Visit: Payer: Medicare Other | Attending: Family Medicine | Admitting: Audiologist

## 2023-01-21 DIAGNOSIS — H903 Sensorineural hearing loss, bilateral: Secondary | ICD-10-CM | POA: Insufficient documentation

## 2023-01-21 NOTE — Procedures (Signed)
  Outpatient Audiology and [facility] [address] [place], [place]  [address] [phone]  AUDIOLOGICAL  EVALUATION  NAME: Henry Roberts     DOB:   October 01, 1965      MRN: [id number]                                                                                     DATE: 01/21/2023     REFERENT: Jackelyn Poling, DO STATUS: Outpatient DIAGNOSIS: Sensorineural Hearing Loss  History: Henry Roberts was seen for an audiological evaluation due to concerns for chronic fluid buildup in his ears. Henry Roberts denies any difficulty hearing. He does hear less birds than he used to. Henry Roberts gets fluid in his ears, sometimes for weeks at a time. A doctor told him its due to his smoking. No pain or pressure today. He has occasional high pitched tinnitus in each ear.    Evaluation:  Otoscopy showed a clear view of the tympanic membranes, bilaterally Tympanometry results were consistent with normal middle ear function, no evidence of fluid today Audiometric testing was completed using Conventional Audiometry techniques with insert earphones and TDH headphones. Test results are consistent with slight to mild sensorineural hearing loss 4-8kHz bilaterally, hearing normal 250-3kHz bilaterally. Speech Recognition Thresholds were obtained at 20dB HL in the right ear and at 15dB HL in the left ear. Word Recognition Testing was completed at  40dB SL and Henry Roberts scored 100% in each ear.    Results:  The test results were reviewed with Henry Roberts. He has a very mild sensorineural hearing loss at the highest pitches. He is not a hearing aid candidate yet. Recommend monitoring hearing, especially due to visual impairment.    Recommendations: 1.   Annual hearing testing recommended due to mild sensorineural hearing loss  27 minutes spent testing and counseling on results.   If you have any questions please feel free to contact me at [phone].  Ammie Ferrier Au.D.  Audiologist   01/21/2023  3:01 PM  Cc: Jackelyn Poling, DO

## 2023-02-11 DIAGNOSIS — R051 Acute cough: Secondary | ICD-10-CM | POA: Diagnosis not present

## 2023-02-25 DIAGNOSIS — F17200 Nicotine dependence, unspecified, uncomplicated: Secondary | ICD-10-CM | POA: Diagnosis not present

## 2023-02-25 DIAGNOSIS — Z0000 Encounter for general adult medical examination without abnormal findings: Secondary | ICD-10-CM | POA: Diagnosis not present

## 2023-02-25 DIAGNOSIS — E785 Hyperlipidemia, unspecified: Secondary | ICD-10-CM | POA: Diagnosis not present

## 2023-02-25 DIAGNOSIS — Z125 Encounter for screening for malignant neoplasm of prostate: Secondary | ICD-10-CM | POA: Diagnosis not present

## 2023-02-25 DIAGNOSIS — I700 Atherosclerosis of aorta: Secondary | ICD-10-CM | POA: Diagnosis not present

## 2023-02-25 DIAGNOSIS — R7303 Prediabetes: Secondary | ICD-10-CM | POA: Diagnosis not present

## 2023-02-25 DIAGNOSIS — R911 Solitary pulmonary nodule: Secondary | ICD-10-CM | POA: Diagnosis not present

## 2023-02-25 DIAGNOSIS — H547 Unspecified visual loss: Secondary | ICD-10-CM | POA: Diagnosis not present

## 2023-03-19 ENCOUNTER — Other Ambulatory Visit: Payer: Self-pay | Admitting: Thoracic Surgery (Cardiothoracic Vascular Surgery)

## 2023-03-19 DIAGNOSIS — R911 Solitary pulmonary nodule: Secondary | ICD-10-CM

## 2023-04-08 DIAGNOSIS — R051 Acute cough: Secondary | ICD-10-CM | POA: Diagnosis not present

## 2023-04-08 DIAGNOSIS — J205 Acute bronchitis due to respiratory syncytial virus: Secondary | ICD-10-CM | POA: Diagnosis not present

## 2023-04-15 ENCOUNTER — Ambulatory Visit
Admission: RE | Admit: 2023-04-15 | Discharge: 2023-04-15 | Disposition: A | Discharge status: Home / Self Care | Payer: Medicare Other | Source: Ambulatory Visit | Attending: Thoracic Surgery (Cardiothoracic Vascular Surgery) | Admitting: Thoracic Surgery (Cardiothoracic Vascular Surgery)

## 2023-04-15 DIAGNOSIS — R911 Solitary pulmonary nodule: Secondary | ICD-10-CM

## 2023-04-15 DIAGNOSIS — R918 Other nonspecific abnormal finding of lung field: Secondary | ICD-10-CM | POA: Diagnosis not present

## 2023-04-15 DIAGNOSIS — J9809 Other diseases of bronchus, not elsewhere classified: Secondary | ICD-10-CM | POA: Diagnosis not present

## 2023-04-22 ENCOUNTER — Encounter: Payer: Self-pay | Admitting: Thoracic Surgery (Cardiothoracic Vascular Surgery)

## 2023-04-22 ENCOUNTER — Ambulatory Visit (INDEPENDENT_AMBULATORY_CARE_PROVIDER_SITE_OTHER): Payer: Medicare Other | Admitting: Thoracic Surgery (Cardiothoracic Vascular Surgery)

## 2023-04-22 VITALS — BP 143/74 | HR 82 | Resp 18 | Ht 66.0 in | Wt 159.0 lb

## 2023-04-22 DIAGNOSIS — R911 Solitary pulmonary nodule: Secondary | ICD-10-CM | POA: Diagnosis not present

## 2023-04-22 NOTE — Progress Notes (Signed)
[address]       [place] [address]             [phone]     HPI: Henry Roberts returns for follow-up of a right lower lobe lung nodule  Henry Roberts is a 58 year old legally blind man with a history of tobacco use, COPD, and a right lower lobe lung nodule.  Smoked about a pack and a half a day for 40 years.  Says he has "cut back" since his last visit.  Found to have a suspicious 2 cm mixed density nodule in the superior segment of the right lower lobe on a low-dose CT for lung cancer screening in January 2024.  He saw Dr. Carollee Massed at [facility] who recommended resection.  He refused that and also refused stereotactic radiation.  I saw him for a second opinion.  I also recommended resection as first choice.  If not, then biopsy and stereotactic radiation would be the second line of treatment.  Emphasized the likelihood that this is a low-grade malignancy.  He again refused surgery, biopsy and referral to radiation oncology.  Past Medical History:  Diagnosis Date   Blind     Current Outpatient Medications  Medication Sig Dispense Refill   albuterol (VENTOLIN HFA) 108 (90 Base) MCG/ACT inhaler Inhale 2 puffs into the lungs every 4 (four) hours as needed.     Ascorbic Acid (VITAMIN C) 1000 MG tablet Take 1,000 mg by mouth daily.     Cholecalciferol (HM VITAMIN D3) 100 MCG (4000 UT) CAPS Take 4,000 Units by mouth daily.      meclizine (ANTIVERT) 25 MG tablet Take 1 tablet (25 mg total) by mouth 3 (three) times daily as needed for dizziness. 20 tablet 0   No current facility-administered medications for this visit.    Physical Exam BP (!) 143/74 (BP Location: Left Arm)   Pulse 82   Resp 18   Ht 5\' 6"  (1.676 m)   Wt 159 lb (72.1 kg)   SpO2 95% Comment: rA  BMI 25.33 kg/m  58 year old man in no acute distress Alert and oriented x 3 Lungs clear bilaterally Cardiac regular rate and rhythm No cervical or supraclavicular adenopathy  Diagnostic Tests: CT CHEST  WITHOUT CONTRAST   TECHNIQUE: Multidetector CT imaging of the chest was performed following the standard protocol without IV contrast.   RADIATION DOSE REDUCTION: This exam was performed according to the departmental dose-optimization program which includes automated exposure control, adjustment of the mA and/or kV according to patient size and/or use of iterative reconstruction technique.   COMPARISON:  10/08/2022   FINDINGS: Cardiovascular: No significant vascular findings. Normal heart size. No pericardial effusion.   Mediastinum/Nodes: No enlarged mediastinal, hilar, or axillary lymph nodes. Thyroid gland, trachea, and esophagus demonstrate no significant findings.   Lungs/Pleura: Diffuse bilateral bronchial wall thickening. No significant change in a spiculated subsolid nodule of the medial posterior right lower lobe measuring 1.9 x 1.2 cm (series 8, image 88). No pleural effusion or pneumothorax.   Upper Abdomen: No acute abnormality.   Musculoskeletal: No chest wall abnormality. No acute osseous findings.   IMPRESSION: 1. No significant change in a spiculated subsolid nodule of the medial posterior right lower lobe measuring 1.9 x 1.2 cm. Although stability is reassuring, this remains morphologically suspicious for adenocarcinoma. 2. Diffuse bilateral bronchial wall thickening, consistent with nonspecific infectious or inflammatory bronchitis.   Aortic Atherosclerosis (ICD10-I70.0).     Electronically Signed   By: Trinna Post  Jane Canary M.D.   On: 04/15/2023 13:23  I personally reviewed the CT images.  1.9 x 1.2 cm spiculated mixed density nodule in superior segment of right lower lobe.  No mediastinal or hilar adenopathy.  Impression: Henry Roberts is a 58 year old legally blind man with a history of tobacco use, COPD, and a right lower lobe lung nodule.    Right lower lobe lung nodule-highly suspicious for new primary bronchogenic carcinoma.  I explained to him in  very frank terms that the nodule is almost certainly a cancer and needs to be considered a cancer unless it can be proven otherwise.  I recommended surgical resection.  He refused to consider that.  Offered option of bronchoscopic biopsy and/or referral to radiation oncology to discuss stereotactic radiation.  He also refused that option.    I emphasized that all of the nodule has not changed between scans.  It is very likely that some point he will come back with significant progression of disease.  Local therapy alone might not be an option at that point and might require chemotherapy.  He understands the concept but it does not change his decision.  Tobacco use-continues to smoke.  Emphasized importance of cessation.  Plan: Follow-up with Dr. Atha Starks I will be happy to see Mr. Jewkes back if he chooses to pursue workup and treatment of his lung nodule.  Loreli Slot, MD [facility] [phone]

## 2023-05-23 DIAGNOSIS — H524 Presbyopia: Secondary | ICD-10-CM | POA: Diagnosis not present

## 2023-05-23 DIAGNOSIS — H35033 Hypertensive retinopathy, bilateral: Secondary | ICD-10-CM | POA: Diagnosis not present

## 2023-05-23 DIAGNOSIS — H04123 Dry eye syndrome of bilateral lacrimal glands: Secondary | ICD-10-CM | POA: Diagnosis not present

## 2023-05-23 DIAGNOSIS — H3552 Pigmentary retinal dystrophy: Secondary | ICD-10-CM | POA: Diagnosis not present

## 2023-08-29 DIAGNOSIS — E785 Hyperlipidemia, unspecified: Secondary | ICD-10-CM | POA: Diagnosis not present

## 2023-08-29 DIAGNOSIS — R0981 Nasal congestion: Secondary | ICD-10-CM | POA: Diagnosis not present

## 2023-08-29 DIAGNOSIS — I700 Atherosclerosis of aorta: Secondary | ICD-10-CM | POA: Diagnosis not present

## 2023-08-29 DIAGNOSIS — F17200 Nicotine dependence, unspecified, uncomplicated: Secondary | ICD-10-CM | POA: Diagnosis not present

## 2023-08-29 DIAGNOSIS — J449 Chronic obstructive pulmonary disease, unspecified: Secondary | ICD-10-CM | POA: Diagnosis not present

## 2023-08-29 DIAGNOSIS — R911 Solitary pulmonary nodule: Secondary | ICD-10-CM | POA: Diagnosis not present

## 2023-10-28 DIAGNOSIS — J441 Chronic obstructive pulmonary disease with (acute) exacerbation: Secondary | ICD-10-CM | POA: Diagnosis not present

## 2023-10-28 DIAGNOSIS — J019 Acute sinusitis, unspecified: Secondary | ICD-10-CM | POA: Diagnosis not present

## 2023-12-08 DIAGNOSIS — S4992XA Unspecified injury of left shoulder and upper arm, initial encounter: Secondary | ICD-10-CM | POA: Diagnosis not present
